# Patient Record
Sex: Male | Born: 1965 | Race: White | Hispanic: No | Marital: Single | Smoking: Current every day smoker
Health system: Southern US, Community
[De-identification: ages and names within clinical notes are randomized; demographics above are authoritative.]

## PROBLEM LIST (undated history)

## (undated) DIAGNOSIS — F419 Anxiety disorder, unspecified: Secondary | ICD-10-CM

## (undated) DIAGNOSIS — N289 Disorder of kidney and ureter, unspecified: Secondary | ICD-10-CM

## (undated) DIAGNOSIS — I2699 Other pulmonary embolism without acute cor pulmonale: Secondary | ICD-10-CM

## (undated) DIAGNOSIS — F1910 Other psychoactive substance abuse, uncomplicated: Secondary | ICD-10-CM

## (undated) DIAGNOSIS — I10 Essential (primary) hypertension: Secondary | ICD-10-CM

## (undated) DIAGNOSIS — G709 Myoneural disorder, unspecified: Secondary | ICD-10-CM

## (undated) DIAGNOSIS — S2249XA Multiple fractures of ribs, unspecified side, initial encounter for closed fracture: Secondary | ICD-10-CM

## (undated) DIAGNOSIS — J939 Pneumothorax, unspecified: Secondary | ICD-10-CM

## (undated) DIAGNOSIS — S42309A Unspecified fracture of shaft of humerus, unspecified arm, initial encounter for closed fracture: Secondary | ICD-10-CM

## (undated) DIAGNOSIS — S2239XA Fracture of one rib, unspecified side, initial encounter for closed fracture: Secondary | ICD-10-CM

## (undated) DIAGNOSIS — M549 Dorsalgia, unspecified: Secondary | ICD-10-CM

## (undated) HISTORY — PX: BACK SURGERY: SHX140

## (undated) HISTORY — DX: Myoneural disorder, unspecified: G70.9

## (undated) HISTORY — DX: Anxiety disorder, unspecified: F41.9

## (undated) HISTORY — DX: Other pulmonary embolism without acute cor pulmonale: I26.99

## (undated) HISTORY — PX: OTHER SURGICAL HISTORY: SHX169

---

## 2000-10-14 ENCOUNTER — Encounter (HOSPITAL_COMMUNITY): Admission: RE | Admit: 2000-10-14 | Discharge: 2000-11-13 | Payer: Self-pay | Admitting: Orthopaedic Surgery

## 2001-01-28 ENCOUNTER — Encounter: Payer: Self-pay | Admitting: Orthopedic Surgery

## 2001-01-28 ENCOUNTER — Ambulatory Visit (HOSPITAL_COMMUNITY): Admission: RE | Admit: 2001-01-28 | Discharge: 2001-01-28 | Payer: Self-pay | Admitting: Orthopedic Surgery

## 2001-03-02 ENCOUNTER — Ambulatory Visit (HOSPITAL_COMMUNITY): Admission: RE | Admit: 2001-03-02 | Discharge: 2001-03-02 | Payer: Self-pay | Admitting: Orthopedic Surgery

## 2003-08-14 ENCOUNTER — Encounter: Payer: Self-pay | Admitting: Orthopedic Surgery

## 2004-05-03 ENCOUNTER — Ambulatory Visit (HOSPITAL_COMMUNITY): Admission: RE | Admit: 2004-05-03 | Discharge: 2004-05-03 | Payer: Self-pay | Admitting: Family Medicine

## 2004-08-21 ENCOUNTER — Ambulatory Visit: Payer: Self-pay | Admitting: Orthopedic Surgery

## 2004-09-11 ENCOUNTER — Ambulatory Visit: Payer: Self-pay | Admitting: Orthopedic Surgery

## 2005-08-05 ENCOUNTER — Ambulatory Visit (HOSPITAL_COMMUNITY): Admission: RE | Admit: 2005-08-05 | Discharge: 2005-08-05 | Payer: Self-pay | Admitting: Family Medicine

## 2005-09-30 ENCOUNTER — Ambulatory Visit (HOSPITAL_COMMUNITY): Admission: RE | Admit: 2005-09-30 | Discharge: 2005-09-30 | Payer: Self-pay | Admitting: Neurosurgery

## 2005-11-21 ENCOUNTER — Ambulatory Visit: Payer: Self-pay | Admitting: Physical Medicine & Rehabilitation

## 2005-11-21 ENCOUNTER — Encounter
Admission: RE | Admit: 2005-11-21 | Discharge: 2006-02-19 | Payer: Self-pay | Admitting: Physical Medicine & Rehabilitation

## 2006-09-25 ENCOUNTER — Encounter
Admission: RE | Admit: 2006-09-25 | Discharge: 2006-12-24 | Payer: Self-pay | Admitting: Physical Medicine & Rehabilitation

## 2006-10-13 ENCOUNTER — Ambulatory Visit: Payer: Self-pay | Admitting: Physical Medicine & Rehabilitation

## 2006-11-24 ENCOUNTER — Ambulatory Visit: Payer: Self-pay | Admitting: Physical Medicine & Rehabilitation

## 2006-12-23 ENCOUNTER — Encounter
Admission: RE | Admit: 2006-12-23 | Discharge: 2007-03-23 | Payer: Self-pay | Admitting: Physical Medicine & Rehabilitation

## 2007-02-15 ENCOUNTER — Ambulatory Visit: Payer: Self-pay | Admitting: Physical Medicine & Rehabilitation

## 2007-04-02 ENCOUNTER — Ambulatory Visit: Payer: Self-pay | Admitting: Physical Medicine & Rehabilitation

## 2007-04-02 ENCOUNTER — Encounter
Admission: RE | Admit: 2007-04-02 | Discharge: 2007-07-01 | Payer: Self-pay | Admitting: Physical Medicine & Rehabilitation

## 2007-04-14 ENCOUNTER — Encounter (HOSPITAL_COMMUNITY)
Admission: RE | Admit: 2007-04-14 | Discharge: 2007-05-14 | Payer: Self-pay | Admitting: Physical Medicine & Rehabilitation

## 2007-05-10 ENCOUNTER — Ambulatory Visit: Payer: Self-pay | Admitting: Physical Medicine & Rehabilitation

## 2007-05-17 ENCOUNTER — Encounter
Admission: RE | Admit: 2007-05-17 | Discharge: 2007-06-09 | Payer: Self-pay | Admitting: Physical Medicine & Rehabilitation

## 2007-06-01 ENCOUNTER — Ambulatory Visit: Payer: Self-pay | Admitting: Physical Medicine & Rehabilitation

## 2007-06-28 ENCOUNTER — Encounter
Admission: RE | Admit: 2007-06-28 | Discharge: 2007-07-09 | Payer: Self-pay | Admitting: Physical Medicine & Rehabilitation

## 2007-11-30 ENCOUNTER — Observation Stay (HOSPITAL_COMMUNITY): Admission: RE | Admit: 2007-11-30 | Discharge: 2007-12-01 | Payer: Self-pay | Admitting: Neurosurgery

## 2008-01-03 ENCOUNTER — Encounter (HOSPITAL_COMMUNITY): Admission: RE | Admit: 2008-01-03 | Discharge: 2008-02-02 | Payer: Self-pay | Admitting: Neurosurgery

## 2008-02-23 ENCOUNTER — Ambulatory Visit (HOSPITAL_COMMUNITY): Admission: RE | Admit: 2008-02-23 | Discharge: 2008-02-24 | Payer: Self-pay | Admitting: Neurosurgery

## 2008-03-27 ENCOUNTER — Emergency Department (HOSPITAL_COMMUNITY): Admission: EM | Admit: 2008-03-27 | Discharge: 2008-03-27 | Payer: Self-pay | Admitting: Emergency Medicine

## 2008-03-29 ENCOUNTER — Encounter (HOSPITAL_COMMUNITY): Admission: RE | Admit: 2008-03-29 | Discharge: 2008-04-28 | Payer: Self-pay | Admitting: Neurosurgery

## 2008-05-09 ENCOUNTER — Ambulatory Visit (HOSPITAL_COMMUNITY): Admission: RE | Admit: 2008-05-09 | Discharge: 2008-05-09 | Payer: Self-pay | Admitting: Neurosurgery

## 2008-05-23 ENCOUNTER — Inpatient Hospital Stay (HOSPITAL_COMMUNITY): Admission: RE | Admit: 2008-05-23 | Discharge: 2008-05-26 | Payer: Self-pay | Admitting: Neurosurgery

## 2008-06-21 ENCOUNTER — Inpatient Hospital Stay (HOSPITAL_COMMUNITY): Admission: EM | Admit: 2008-06-21 | Discharge: 2008-06-26 | Payer: Self-pay | Admitting: Emergency Medicine

## 2008-06-27 ENCOUNTER — Encounter (HOSPITAL_COMMUNITY): Admission: RE | Admit: 2008-06-27 | Discharge: 2008-07-27 | Payer: Self-pay | Admitting: Family Medicine

## 2008-06-27 ENCOUNTER — Ambulatory Visit (HOSPITAL_COMMUNITY): Payer: Self-pay | Admitting: Family Medicine

## 2008-08-21 ENCOUNTER — Ambulatory Visit (HOSPITAL_COMMUNITY): Admission: RE | Admit: 2008-08-21 | Discharge: 2008-08-21 | Payer: Self-pay | Admitting: Family Medicine

## 2008-08-22 ENCOUNTER — Encounter (HOSPITAL_COMMUNITY): Admission: RE | Admit: 2008-08-22 | Discharge: 2008-09-21 | Payer: Self-pay | Admitting: Neurosurgery

## 2008-09-21 ENCOUNTER — Emergency Department (HOSPITAL_COMMUNITY): Admission: EM | Admit: 2008-09-21 | Discharge: 2008-09-21 | Payer: Self-pay | Admitting: Emergency Medicine

## 2008-09-27 ENCOUNTER — Encounter (HOSPITAL_COMMUNITY): Admission: RE | Admit: 2008-09-27 | Discharge: 2008-10-27 | Payer: Self-pay | Admitting: Neurosurgery

## 2008-12-03 ENCOUNTER — Emergency Department (HOSPITAL_COMMUNITY): Admission: EM | Admit: 2008-12-03 | Discharge: 2008-12-03 | Payer: Self-pay | Admitting: Emergency Medicine

## 2008-12-19 ENCOUNTER — Encounter: Payer: Self-pay | Admitting: Family Medicine

## 2008-12-21 ENCOUNTER — Ambulatory Visit: Payer: Self-pay | Admitting: Orthopedic Surgery

## 2008-12-21 ENCOUNTER — Ambulatory Visit (HOSPITAL_COMMUNITY): Admission: RE | Admit: 2008-12-21 | Discharge: 2008-12-21 | Payer: Self-pay | Admitting: Family Medicine

## 2008-12-21 DIAGNOSIS — M23302 Other meniscus derangements, unspecified lateral meniscus, unspecified knee: Secondary | ICD-10-CM

## 2008-12-21 DIAGNOSIS — M25569 Pain in unspecified knee: Secondary | ICD-10-CM

## 2008-12-21 DIAGNOSIS — G5620 Lesion of ulnar nerve, unspecified upper limb: Secondary | ICD-10-CM

## 2008-12-21 DIAGNOSIS — M7020 Olecranon bursitis, unspecified elbow: Secondary | ICD-10-CM

## 2008-12-22 ENCOUNTER — Emergency Department (HOSPITAL_COMMUNITY): Admission: EM | Admit: 2008-12-22 | Discharge: 2008-12-22 | Payer: Self-pay | Admitting: Emergency Medicine

## 2008-12-27 ENCOUNTER — Telehealth: Payer: Self-pay | Admitting: Orthopedic Surgery

## 2008-12-28 ENCOUNTER — Ambulatory Visit (HOSPITAL_COMMUNITY): Admission: RE | Admit: 2008-12-28 | Discharge: 2008-12-28 | Payer: Self-pay | Admitting: Orthopedic Surgery

## 2009-01-01 ENCOUNTER — Encounter: Payer: Self-pay | Admitting: Orthopedic Surgery

## 2009-01-25 ENCOUNTER — Encounter: Payer: Self-pay | Admitting: Orthopedic Surgery

## 2009-02-08 ENCOUNTER — Ambulatory Visit (HOSPITAL_COMMUNITY): Admission: RE | Admit: 2009-02-08 | Discharge: 2009-02-08 | Payer: Self-pay | Admitting: Orthopedic Surgery

## 2009-02-26 ENCOUNTER — Telehealth: Payer: Self-pay | Admitting: Orthopedic Surgery

## 2009-04-05 ENCOUNTER — Ambulatory Visit (HOSPITAL_COMMUNITY): Admission: RE | Admit: 2009-04-05 | Discharge: 2009-04-05 | Payer: Self-pay | Admitting: Orthopedic Surgery

## 2009-06-14 IMAGING — CT CT ANGIO CHEST
1 of 2 series · 19 of 32 positions shown · IV contrast (Omnipaque 300)
Comparison: None

CLINICAL DATA: Right-sided chest pain

CT ANGIOGRAPHY CHEST
TECHNIQUE: Multidetector CT imaging of the chest using the
standard protocol during bolus administration of intravenous
contrast. Multiplanar reconstructed images including MIPs were
obtained and reviewed to evaluate the vascular anatomy.
Contrast: 80 ml [id number]-X33

[Series 8: thin pacs · axial · 0.73mm/px · z∈[-362,-88]mm · 19 of 306 slices shown]
[im 16/306  lung]
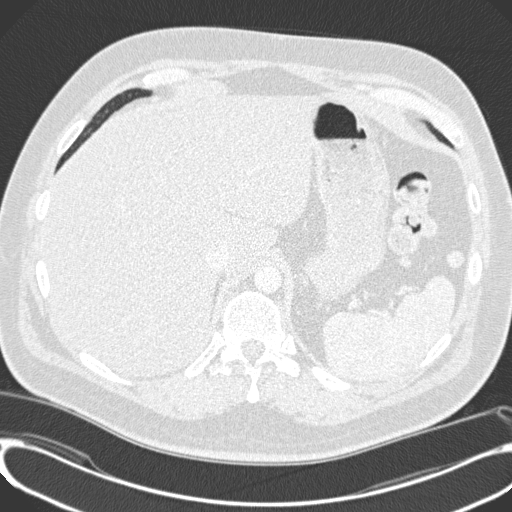
[im 31/306  mediastinal]
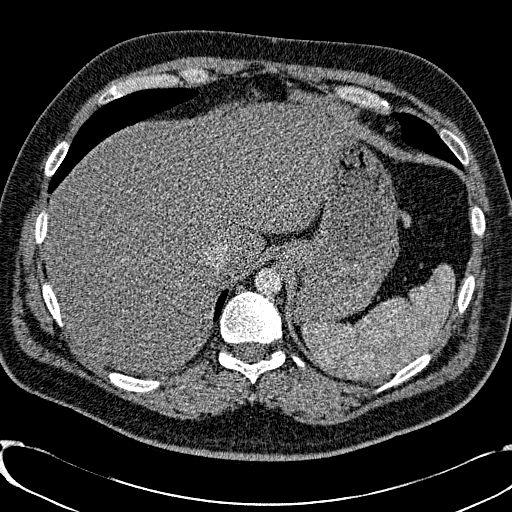
[im 46/306  lung]
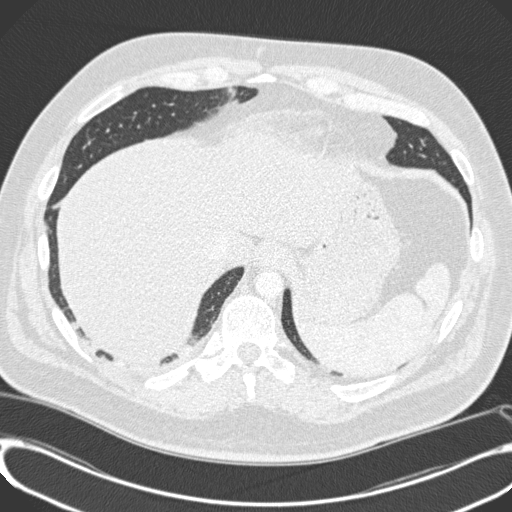
[im 77/306  mediastinal]
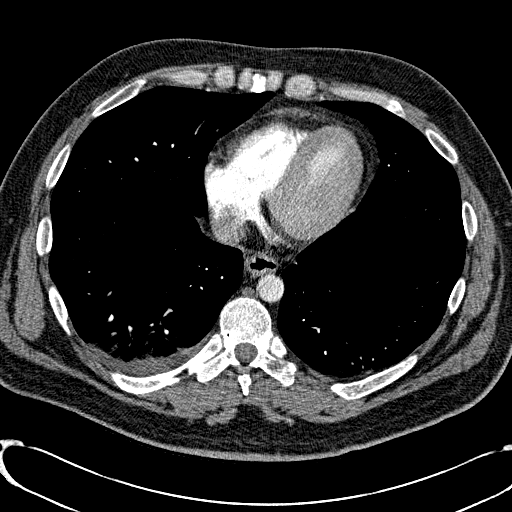
[im 92/306  lung]
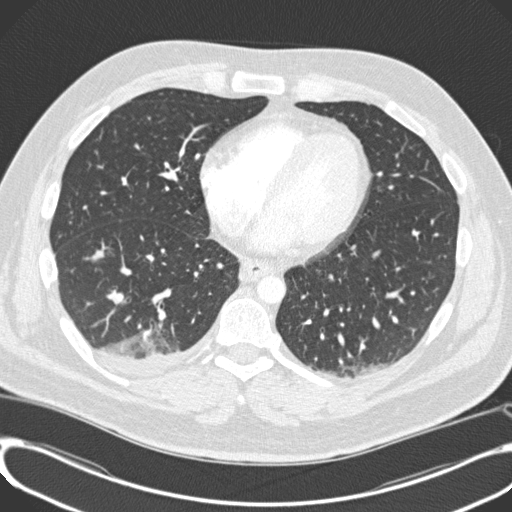
[im 102/306  mediastinal]
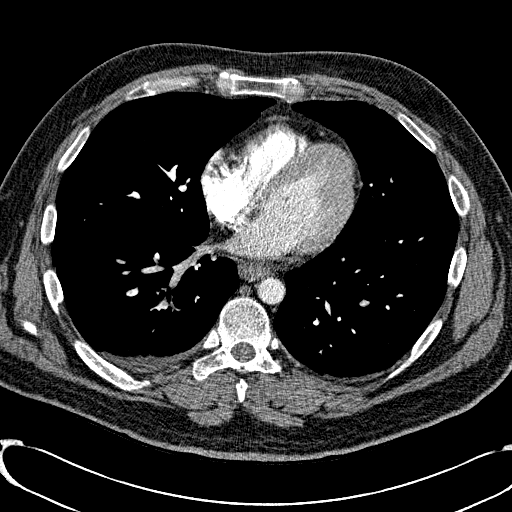
[im 107/306  lung]
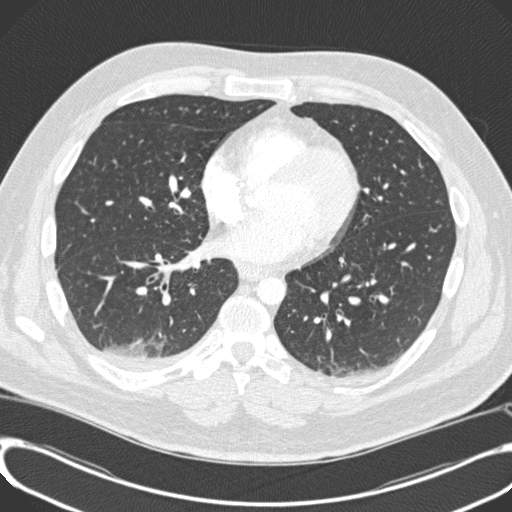
[im 123/306  mediastinal]
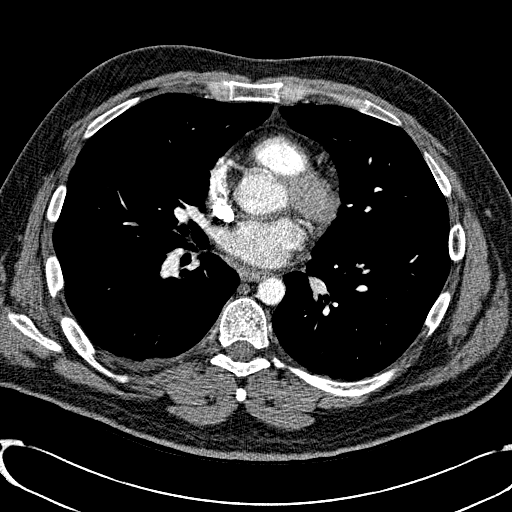
[im 138/306  lung]
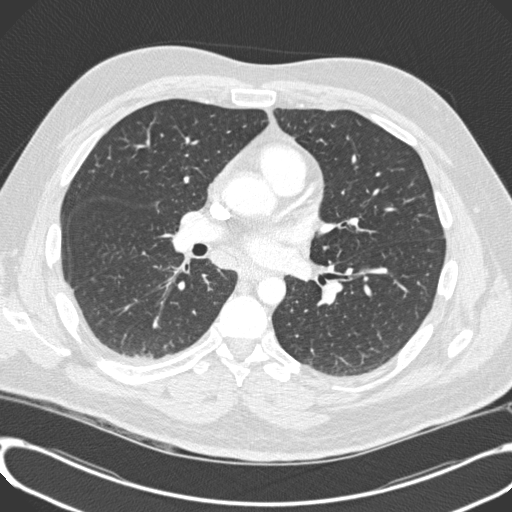
[im 153/306  mediastinal]
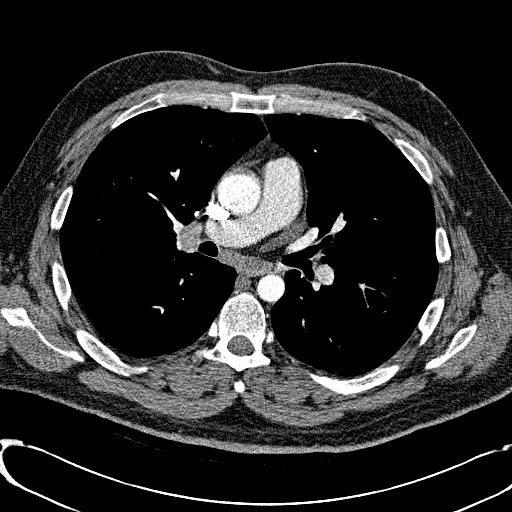
[im 168/306  lung]
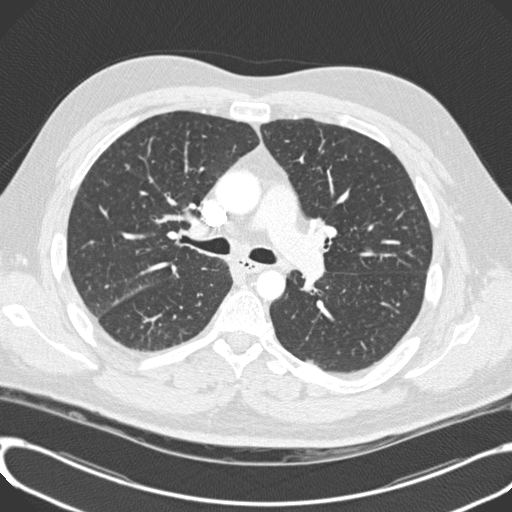
[im 184/306  mediastinal]
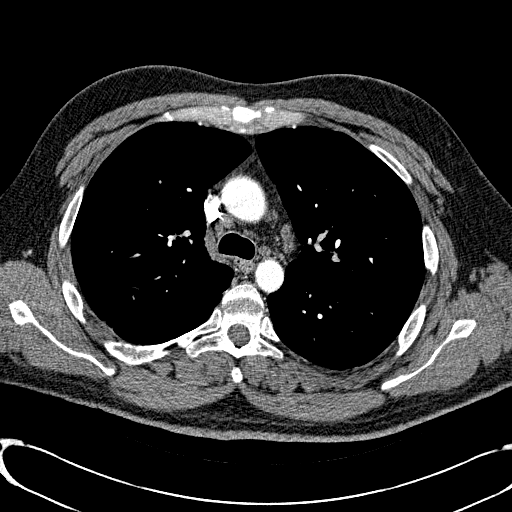
[im 199/306  lung]
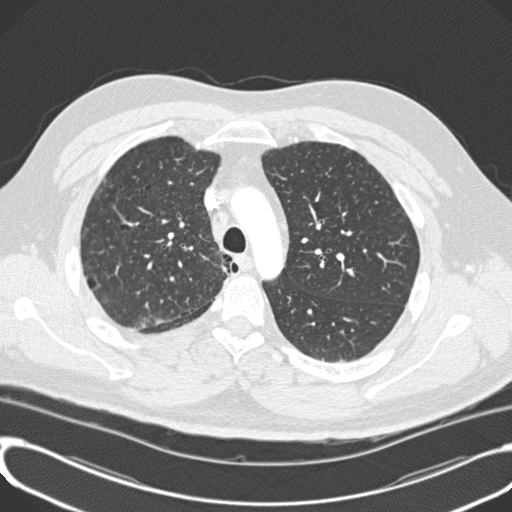
[im 204/306  mediastinal]
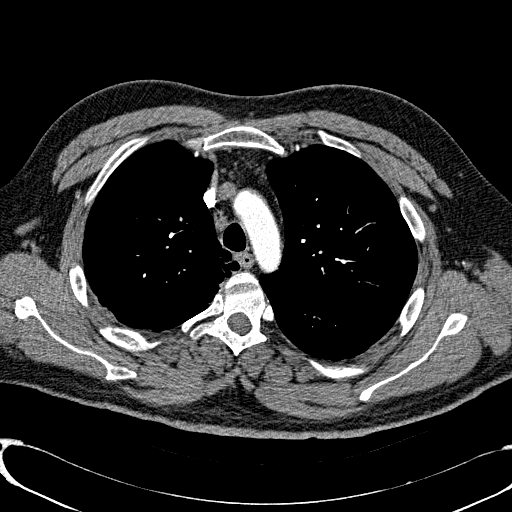
[im 214/306  lung]
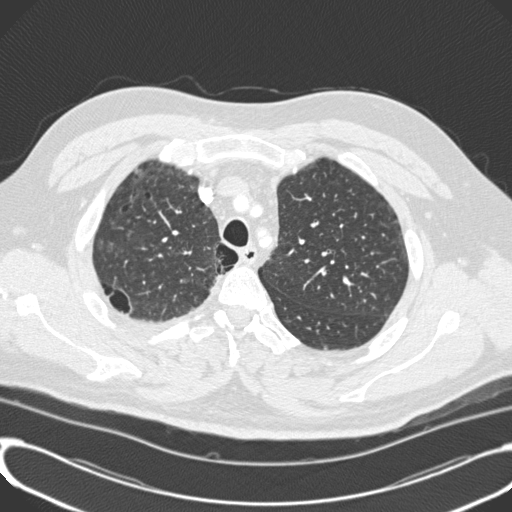
[im 229/306  mediastinal]
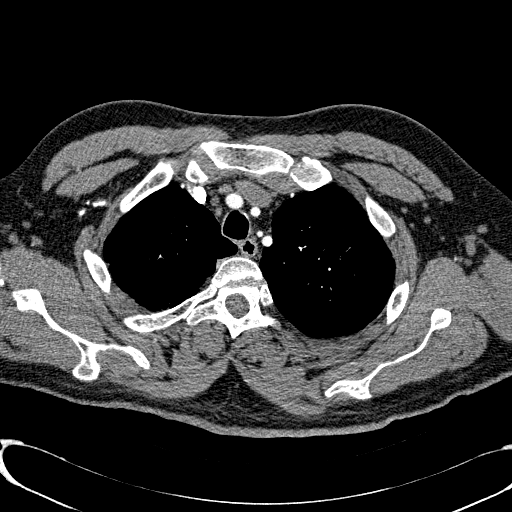
[im 260/306  lung]
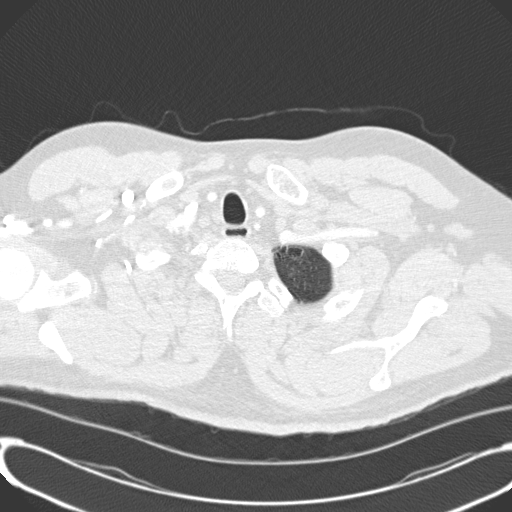
[im 275/306  mediastinal]
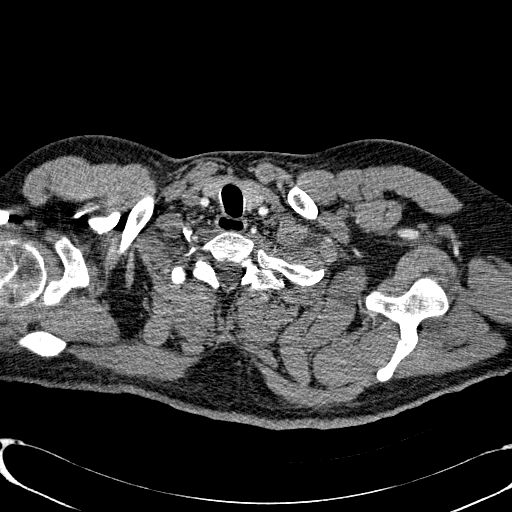
[im 290/306  lung]
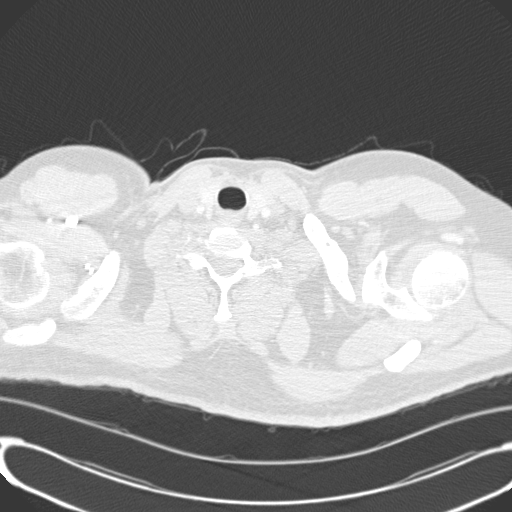

[19 of 32 positions shown; findings below may reference images not displayed]

FINDINGS: The chest wall is unremarkable.  The heart is normal in
size.  No pericardial effusion.  There are borderline mediastinal
and hilar lymph nodes.  The esophagus is grossly normal.  The aorta
is normal in caliber.  No dissection.

The pulmonary arterial tree is suboptimally opacified but there are
filling defects in the right lower lobe pulmonary artery branches
consistent with pulmonary emboli.

Examination of the lung parenchyma demonstrates apical bullous
changes bilaterally.  There is right greater than left lower lobe
atelectasis and a small right pleural effusion.  The visualized
upper abdomen is unremarkable.
IMPRESSION: 1.  Right-sided pulmonary emboli.
2.  Normal thoracic aorta.
3.  Borderline mediastinal and hilar lymph nodes.
4.  Right greater than left lower lobe atelectasis and small right
effusion.

## 2009-11-07 ENCOUNTER — Encounter: Payer: Self-pay | Admitting: Orthopedic Surgery

## 2010-06-30 ENCOUNTER — Encounter: Payer: Self-pay | Admitting: Neurosurgery

## 2010-07-09 NOTE — Letter (Signed)
Summary: Medical record request [facility]  Medical record request [facility]   Imported By: Cammie Sickle 11/26/2009 18:39:11  _____________________________________________________________________  External Attachment:    Type:   Image     Comment:   External Document

## 2010-09-12 LAB — COMPREHENSIVE METABOLIC PANEL
Albumin: 4.3 g/dL (ref 3.5–5.2)
Alkaline Phosphatase: 79 U/L (ref 39–117)
BUN: 7 mg/dL (ref 6–23)
CO2: 26 mEq/L (ref 19–32)
Chloride: 104 mEq/L (ref 96–112)
Glucose, Bld: 82 mg/dL (ref 70–99)
Potassium: 4.6 mEq/L (ref 3.5–5.1)
Total Bilirubin: 0.3 mg/dL (ref 0.3–1.2)

## 2010-09-12 LAB — CBC
HCT: 49 % (ref 39.0–52.0)
Hemoglobin: 16.7 g/dL (ref 13.0–17.0)
Platelets: 236 10*3/uL (ref 150–400)
RBC: 5.6 MIL/uL (ref 4.22–5.81)
WBC: 11.3 10*3/uL — ABNORMAL HIGH (ref 4.0–10.5)

## 2010-09-12 LAB — DIFFERENTIAL
Basophils Absolute: 0 10*3/uL (ref 0.0–0.1)
Basophils Relative: 0 % (ref 0–1)
Monocytes Absolute: 0.5 10*3/uL (ref 0.1–1.0)
Neutro Abs: 7.3 10*3/uL (ref 1.7–7.7)

## 2010-09-15 LAB — BASIC METABOLIC PANEL
BUN: 8 mg/dL (ref 6–23)
Creatinine, Ser: 0.9 mg/dL (ref 0.4–1.5)
GFR calc non Af Amer: >60 mL/min (ref >60)
Potassium: 4 mEq/L (ref 3.5–5.1)

## 2010-09-15 LAB — DIFFERENTIAL
Lymphocytes Relative: 32 % (ref 12–46)
Lymphs Abs: 2.2 10*3/uL (ref 0.7–4.0)
Neutro Abs: 4 10*3/uL (ref 1.7–7.7)
Neutrophils Relative %: 57 % (ref 43–77)

## 2010-09-15 LAB — CBC
Platelets: 204 10*3/uL (ref 150–400)
WBC: 6.9 10*3/uL (ref 4.0–10.5)

## 2010-09-15 LAB — PROTIME-INR
INR: 2 — ABNORMAL HIGH (ref 0.00–1.49)
Prothrombin Time: 24.1 seconds — ABNORMAL HIGH (ref 11.6–15.2)

## 2010-09-23 LAB — BASIC METABOLIC PANEL
BUN: 10 mg/dL (ref 6–23)
CO2: 27 mEq/L (ref 19–32)
Chloride: 101 mEq/L (ref 96–112)
Creatinine, Ser: 1.01 mg/dL (ref 0.4–1.5)

## 2010-09-23 LAB — DIFFERENTIAL
Basophils Absolute: 0 10*3/uL (ref 0.0–0.1)
Basophils Relative: 1 % (ref 0–1)
Basophils Relative: 3 % — ABNORMAL HIGH (ref 0–1)
Eosinophils Absolute: 0.2 10*3/uL (ref 0.0–0.7)
Eosinophils Absolute: 0.3 10*3/uL (ref 0.0–0.7)
Eosinophils Relative: 2 % (ref 0–5)
Eosinophils Relative: 3 % (ref 0–5)
Lymphocytes Relative: 26 % (ref 12–46)
Monocytes Absolute: 0.7 10*3/uL (ref 0.1–1.0)
Monocytes Relative: 6 % (ref 3–12)
Monocytes Relative: 9 % (ref 3–12)
Neutrophils Relative %: 62 % (ref 43–77)
Neutrophils Relative %: 79 % — ABNORMAL HIGH (ref 43–77)
Neutrophils Relative %: 59 % (ref 43–77)

## 2010-09-23 LAB — PROTIME-INR
INR: 1.1 (ref 0.00–1.49)
INR: 1.1 (ref 0.00–1.49)
INR: 1.6 — ABNORMAL HIGH (ref 0.00–1.49)
Prothrombin Time: 15.3 seconds — ABNORMAL HIGH (ref 11.6–15.2)

## 2010-09-23 LAB — CBC
HCT: 37.8 % — ABNORMAL LOW (ref 39.0–52.0)
HCT: 39.7 % (ref 39.0–52.0)
Hemoglobin: 11.4 g/dL — ABNORMAL LOW (ref 13.0–17.0)
MCHC: 32.9 g/dL (ref 30.0–36.0)
MCHC: 33.1 g/dL (ref 30.0–36.0)
MCHC: 32.1 g/dL (ref 30.0–36.0)
MCV: 82.1 fL (ref 78.0–100.0)
MCV: 82.1 fL (ref 78.0–100.0)
Platelets: 230 10*3/uL (ref 150–400)
Platelets: 262 10*3/uL (ref 150–400)
RBC: 4.83 MIL/uL (ref 4.22–5.81)
RBC: 4.3 MIL/uL (ref 4.22–5.81)
RDW: 15.1 % (ref 11.5–15.5)

## 2010-09-23 LAB — POCT CARDIAC MARKERS

## 2010-10-22 ENCOUNTER — Emergency Department (HOSPITAL_COMMUNITY)
Admission: EM | Admit: 2010-10-22 | Discharge: 2010-10-22 | Disposition: A | Discharge status: Home / Self Care | Payer: Medicare Other | Attending: Emergency Medicine | Admitting: Emergency Medicine

## 2010-10-22 ENCOUNTER — Emergency Department (HOSPITAL_COMMUNITY): Payer: Medicare Other

## 2010-10-22 DIAGNOSIS — M79606 Pain in leg, unspecified: Secondary | ICD-10-CM

## 2010-10-22 DIAGNOSIS — Z79899 Other long term (current) drug therapy: Secondary | ICD-10-CM | POA: Insufficient documentation

## 2010-10-22 DIAGNOSIS — Z86718 Personal history of other venous thrombosis and embolism: Secondary | ICD-10-CM | POA: Insufficient documentation

## 2010-10-22 DIAGNOSIS — M79609 Pain in unspecified limb: Secondary | ICD-10-CM | POA: Insufficient documentation

## 2010-10-22 DIAGNOSIS — E7800 Pure hypercholesterolemia, unspecified: Secondary | ICD-10-CM | POA: Insufficient documentation

## 2010-10-22 DIAGNOSIS — Z86711 Personal history of pulmonary embolism: Secondary | ICD-10-CM | POA: Insufficient documentation

## 2010-10-22 DIAGNOSIS — M7989 Other specified soft tissue disorders: Secondary | ICD-10-CM

## 2010-10-22 LAB — URINALYSIS, ROUTINE W REFLEX MICROSCOPIC
Glucose, UA: NEGATIVE mg/dL
Ketones, ur: 40 mg/dL — AB
Specific Gravity, Urine: >1.03 — ABNORMAL HIGH (ref 1.005–1.030)
pH: 6 (ref 5.0–8.0)

## 2010-10-22 LAB — RAPID URINE DRUG SCREEN, HOSP PERFORMED
Benzodiazepines: POSITIVE — AB
Cocaine: POSITIVE — AB
Opiates: POSITIVE — AB

## 2010-10-22 LAB — DIFFERENTIAL
Eosinophils Absolute: 0.1 10*3/uL (ref 0.0–0.7)
Eosinophils Relative: 1 % (ref 0–5)
Lymphs Abs: 0.8 10*3/uL (ref 0.7–4.0)
Monocytes Relative: 9 % (ref 3–12)

## 2010-10-22 LAB — BASIC METABOLIC PANEL
BUN: 9 mg/dL (ref 6–23)
Chloride: 104 mEq/L (ref 96–112)
Glucose, Bld: 101 mg/dL — ABNORMAL HIGH (ref 70–99)
Potassium: 3.8 mEq/L (ref 3.5–5.1)

## 2010-10-22 LAB — CBC
MCH: 28 pg (ref 26.0–34.0)
MCV: 86.3 fL (ref 78.0–100.0)
Platelets: 254 10*3/uL (ref 150–400)
RDW: 13.4 % (ref 11.5–15.5)

## 2010-10-22 LAB — ETHANOL: Alcohol, Ethyl (B): <11 mg/dL — ABNORMAL HIGH (ref 0–10)

## 2010-10-22 NOTE — Procedures (Signed)
NAMEASHRITH, [name]               ACCOUNT NO.:  [id number]   MEDICAL RECORD NO.:  [id number]          PATIENT TYPE:  REC   LOCATION:  TPC                          FACILITY:  [facility]   PHYSICIAN:  Erick Colace, M.D.DATE OF BIRTH:  Aug 06, 1965   DATE OF PROCEDURE:  DATE OF DISCHARGE:                               OPERATIVE REPORT   PROCEDURE:  Right L5 dorsal ramus injection and radiofrequency  neurotomy, right L4 medial branch block and radiofrequency neurotomy,  right L3 medial branch block and radiofrequency neurotomy under  fluoroscopic guidance.   INDICATIONS:  Lumbar facet syndrome with good response to medial branch  blocks and pain only partially responsive to narcotic analgesic  medications and other adjuvant care.   DESCRIPTION OF PROCEDURE:  Informed consent was obtained after  describing risks and benefits of the procedure with the patient.  These  include bleeding, bruising, infection, loss of bowel and bladder  function, temporary or permanent paralysis.  He elects to proceed and  has given written consent.  The patient placed prone on the fluoroscopy  table.  Betadine prep, sterile drape.  25-gauge 1-1/2-needle was used to  incise skin and subcu tissue.  1% lidocaine x2 mL and then a 20-gauge 10-  cm RF needle with 10-mm curved active tip was inserted under  fluoroscopic guidance, targeting first the right S1 SAP/sacral ala  junction.  Bone contact made and confirmed with lateral imaging.  Motor  stem x3 volts demonstrated no lower extremity twitch followed by  injection of 1 mL of solution containing 1 mL of 4 mg/mL dexamethasone  and 2 mL 1% lidocaine followed by RF lesioning 70 degrees x70 seconds  and then the right L5 SAP/transverse process junction targeted.  Bone  contact made and confirmed with lateral imaging.  Motor stem x3 volts  demonstrated no lower extremity twitch followed by injection of  dexamethasone and lidocaine solution and RF lesioning 70  degrees x70  seconds and then the right L3 SAP/transverse process junction targeted,  bone contact made and confirmed with lateral imaging.  Motor stem x3  volts demonstrated no lower extremity twitch followed by injection of  dexamethasone and lidocaine solution and RF lesioning 70 degrees  x70  seconds.  The patient tolerated the procedure well.  Pre and post-  injection vitals stable.  Pre-injection pain level 6-7/10, post-  injection 0/10.      Erick Colace, M.D.  Electronically Signed     AEK/MEDQ  D:  02/15/2007 16:10:59  T:  02/16/2007 10:42:36  Job:  [id number]

## 2010-10-22 NOTE — Discharge Summary (Signed)
NAMEJOAQUIM, [name]               ACCOUNT NO.:  [id number]   MEDICAL RECORD NO.:  [id number]          PATIENT TYPE:  INP   LOCATION:  A329                          FACILITY:  [facility]   PHYSICIAN:  Mila Homer. Sudie Bailey, M.D.DATE OF BIRTH:  06-30-1965   DATE OF ADMISSION:  06/21/2008  DATE OF DISCHARGE:  01/18/2010LH                               DISCHARGE SUMMARY   This 45 year old was admitted to the hospital with a pulmonary embolus.  He had a benign 6-day hospitalization extending from June 21, 2008,  to June 26, 2008.  Vital signs remained stable.   His admission white cell count is 12,600, hemoglobin 13.0, and platelet  count 362,000 with 79% neutrophils noted.  BMET showed sodium 134,  glucose 116.  Cardiac markers were normal, and PT/INRs were followed  closely during the hospitalization, with an INR starting at 1.1.  First  day Coumadin was used up to 1.8 the day of discharge.  Repeat CBC was  normal during that time.   Admission chest x-ray showed chronic-appearing lung changes, no definite  pulmonary findings, but CT angio of the chest showed right-sided  pulmonary emboli and borderline mediastinal hilar lymph nodes and right  greater than left lower lobe atelectasis, small right effusion.   He was admitted to the hospital.  He was continued on Lyrica 75 mg  t.i.d., diazepam 10 mg b.i.d., started on Coumadin 5 mg, given a normal  saline IV, Zofran for nausea, oxycodone 50 mg q.4 h. for pain, started  Lovenox mg/kg q.12 h.   The second day his IV was discontinued.  He was switched to a Hep-Lock.  He was taken off Lyrica, and oxycodone was increased to 50 mg q.3 h. for  severe pain.  Pharmacy followed his warfarin dose.  Oxycodone was  increased to 30 mg q.4 h. on his third day, and he was given Colace 100  mg daily for constipation.   He gradually improved.  By the day of discharge, he was able to take  deep breaths without having severe pain, which was much  better.   FINAL DISCHARGE DIAGNOSES:  1. Right-sided pulmonary emboli.  2. Recent low back surgery.  3. 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  4. Tobacco use disorder.  5. Anticoagulation.   He was discharged home on his oxycodone 30 mg q.4 h., which is managed  by his neurosurgeon, Dr. Jeral Fruit.  He is to be on Lovenox 150 mg q.12 h.  the next 5 days.  He will be on warfarin 5 mg as directed and will take  2 of them tonight and 2 tomorrow night.  Recheck PT/INR will be done in  2 days.   I have discussed at length with the patient and his mom about the  benefits and dangers of Coumadin and they understand that there is a  narrow therapeutic ratio and that we will need to monitor this very  frequently initially, and  then if it is not used correctly he could get more blood clotting, but  if he has too much of it his bleeding will be very  difficult to stop.  I  have told him the drug can be either life-saving or life-threatening.  He will have close followup with [place].      Mila Homer. Sudie Bailey, M.D.  Electronically Signed     SDK/MEDQ  D:  06/26/2008  T:  06/26/2008  Job:  [id number]

## 2010-10-22 NOTE — H&P (Signed)
NAMEJARAMIAH, BOSSARD               ACCOUNT NO.:  [id number]   MEDICAL RECORD NO.:  [id number]          PATIENT TYPE:  INP   LOCATION:  A329                          FACILITY:  [facility]   PHYSICIAN:  Edward L. Juanetta Gosling, M.D.DATE OF BIRTH:  1965/08/08   DATE OF ADMISSION:  06/21/2008  DATE OF DISCHARGE:  LH                              HISTORY & PHYSICAL   PRIMARY CARE PHYSICIAN:  Mila Homer. Sudie Bailey, M.D.   REASON FOR ADMISSION:  Pulmonary embolus.   HISTORY:  Mr. [name] is a 45 year old who has had recent surgery on his  back x3.  Today he noticed that he had some chest pain and some  shortness of breath when he tried to take a deep breath that got much  worse.  He did not cough up any blood.  He did not have any fever or  chills but he was short of breath.  He eventually came to the emergency  room where a underwent a CT of the chest and that showed that he had  pulmonary emboli on the right side.  This is where is symptoms are.  He  says that otherwise he has been healthy.  He has not noticed any  swelling of his legs.  He has had surgeries on his back but no other  precipitating factors.   PAST MEDICAL HISTORY:  1. Chronic back pain, status post surgery.  2. Hyperlipidemia.  3. He apparently had hypertension at one point.   PAST SURGICAL HISTORY:  Surgically he has had appendectomy and back  surgery.   SOCIAL HISTORY:  Nondrinker.  He does not use any alcohol.  He does  smoke cigars he says.   REVIEW OF SYSTEMS:  His review of systems except as mentioned is  negative.   MEDICATIONS:  1. Oxycodone 15 mg as needed.  2. Diazepam 10 mg as needed.  3. Lyrica 75 mg three times a day.  4. He takes some medication for cholesterol.  He is not clear what      that is.   FAMILY HISTORY:  He does not know of any family history of any sort of  clotting abnormalities.   PHYSICAL EXAMINATION:  GENERAL:  Well-developed, well-nourished male who  is in no acute distress now.  VITAL  SIGNS:  Blood pressure 122/78, temperature is 100.8, pulse 136,  respirations 26.  Later blood pressure 103/62 and pulse 103.  Oxygen  saturation 96% on 2 liters.  HEENT:  His pupils are reactive to light and accommodation.  His nose  and throat are clear.  NECK:  Supple without masses.  He does not have any bruits or jugular  venous distention.  CHEST:  Clear without wheezes, rales or rhonchi.  HEART:  Regular without murmur, gallop or rub.  ABDOMEN:  His abdomen is soft.  No masses are felt.  EXTREMITIES:  Showed no edema.  CENTRAL NERVOUS SYSTEM:  Grossly intact.   ASSESSMENT:  Has had a pulmonary embolus.   PLAN:  He is going to be admitted, started on Lovenox and Coumadin and  Dr. Sudie Bailey will assume his care  in the morning.  I will continue his  regular medications.      Edward L. Juanetta Gosling, M.D.  Electronically Signed     ELH/MEDQ  D:  06/21/2008  T:  06/22/2008  Job:  [id number]

## 2010-10-22 NOTE — Procedures (Signed)
NAMETEX, CONROY               ACCOUNT NO.:  [id number]   MEDICAL RECORD NO.:  [id number]           PATIENT TYPE:   LOCATION:                                 FACILITY:   PHYSICIAN:  Erick Colace, M.D.DATE OF BIRTH:  [birth year]   DATE OF PROCEDURE:  DATE OF DISCHARGE:                               OPERATIVE REPORT   A 45 year old male with left L4transforaminal lumbar epidural steroid  injection under fluoroscopic guidance.   INDICATIONS:  Left L4 radiculopathy with MRI demonstrating L4  impingement on the left side due to lateral recess stenosis, foraminal  stenosis, multifactorial.   Informed consent was obtained after describing the risks and benefits of  the procedure to the patient.  These include bleeding, bruising,  infection, and loss of bowel or bladder function, temporary or permanent  paralysis.  He elects to proceed and has given written consent.  The  patient was placed prone on the fluoroscopy table.  Betadine prep and  sterile drape. A 25-gauge 1-1/2 inch needle was used to anesthetize the  skin and subcutaneous tissue.  Then a 22-gauge 3-1/2-inch spinal needle  was inserted under fluoroscopic guidance targeting the L4-5  intervertebral foramen; AP, lateral, and oblique imaging utilized.  Then  Omnipaque 180 times 0.5 mL demonstrated good nerve root and epidural  spread followed by injection of 1 mL of 40 mg per mL Depo-Medrol and 2  mL of 1% MDF lidocaine.  The patient tolerated the procedure well pre-  and-post injection.  Vitals stable.      Erick Colace, M.D.  Electronically Signed     AEK/MEDQ  D:  01/25/2007 12:02:02  T:  01/25/2007 17:04:22  Job:  [id number]
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
## 2010-10-22 NOTE — Procedures (Signed)
NAMEJACARIE, PATE NO.:  [id number]   MEDICAL RECORD NO.:  [id number]           PATIENT TYPE:   LOCATION:                                 FACILITY:   PHYSICIAN:  Erick Colace, M.D.DATE OF BIRTH:  02-06-1966   DATE OF PROCEDURE:  06/07/2007  DATE OF DISCHARGE:                               OPERATIVE REPORT   Mr. Hernan returns today.  He has a history left L3-4 lateral recess  stenosis, bilateral L4-5 foraminal stenosis.  He has had mainly axial  back pain, but now has more lower extremity pain.  He had a mild level  of relief with L4 injection.  He is here for L3 transforaminal lumbar  epidural steroid injection under fluoroscopic guidance.   INDICATIONS:  Lumbar radiculopathy, unresponsive to ibuprofen, Tylox,  and gabapentin.   Informed consent was obtained after describing risks and benefits of the  procedure with the patient.  These include bleeding, bruising,  infection, loss of bowel or bladder function, and temporary or permanent  paralysis.  She elected to proceed and has given written consent.  The  patient was placed prone on the fluoroscopy table.  Betadine prep and  sterile drape.  A 25-gauge, 1-1/2 inch needle was used to incise skin  and subcutaneous tissue with 1% lidocaine x2 mL.  Then, a 22-gauge, 3-  1/2 inch spinal needle was inserted under fluoroscopic guidance,  targeting the left L3-4 intervertebral foramen.  AP, lateral, and  oblique imaging utilized.  Omnipaque 180 x 0.5 mL demonstrated good dye  flow into the lateral recess and along the nerve root, followed by  injection of 1 mL of 10 mg/mL dexamethasone and 2 mL of 1% MPF  lidocaine.  The patient tolerated the procedure well.  Pre- and  postinjection vitals were stable.  I will see him back in 3-4 weeks.  He  is to follow up with Dr. Jeral Fruit as well.  May benefit from repeat EMG  study, should the present injection be uninformative in terms of level  of nerve root  involvement.      Erick Colace, M.D.  Electronically Signed     AEK/MEDQ  D:  06/07/2007 08:48:50  T:  06/07/2007 09:50:13  Job:  [id number]   cc:   Mila Homer. Sudie Bailey, M.D.  Fax: [phone]

## 2010-10-22 NOTE — Assessment & Plan Note (Signed)
This is a followup visit for back pain and left lower extremity pain.  He has a history of congenital lumbar stenosis, has foraminal stenosis,  L3-4 lateral stenosis, lateral recess, bilateral 4-5 foraminal stenosis,  impingement noted at L3 but not at L4.  He had L3 through S1 facet  degenerative changes on CT myelogram.  An MRI done on August 05, 2005  also showed an L4 impingement on the left.  Did have a normal EMG on  September 26, 2005.   EXAMINATION:  He has normal deep tendon reflexes.  He is able to heel  walk, toe walk.  He has good strength in the lower extremities.   FUNCTIONAL STATUS:  Is working full time.   Followup of medial branch block, he has had some good improvement from  the 6 to a 0 immediately, and this persisted for a couple of hours after  injection.  He did have some post-injection soreness for a couple of  days afterwards in his low back muscles.   IMPRESSION:  1. Lumbar facet syndrome.  Will need to confirm with second set of      medial branch blocks.  2. Left L4 radiculitis due to foraminal stenosis.  This appears to be      intact and positional.   PLAN:  1. Will initiate Neurontin 300 mg nightly, working up to q.i.d.  2. Schedule for medial branch blocks.  3. Consider RF of medial branch blocks are helpful in the short run.  4. May benefit from left L4 transforaminal injection.      Erick Colace, M.D.  Electronically Signed     AEK/MedQ  D:  11/24/2006 10:56:45  T:  11/24/2006 13:51:49  Job #:  [id number]   cc:   Hilda Lias, M.D.  Fax: [phone]

## 2010-10-22 NOTE — Procedures (Signed)
[name], [name]               ACCOUNT NO.:  [id number]   MEDICAL RECORD NO.:  [id number]          PATIENT TYPE:  REC   LOCATION:  TPC                          FACILITY:  [facility]   PHYSICIAN:  Erick Colace, M.D.DATE OF BIRTH:  20-Oct-1965   DATE OF PROCEDURE:  03/15/2007  DATE OF DISCHARGE:                               OPERATIVE REPORT   PROCEDURE:  Left radiofrequency neurotomy under fluoroscopic guidance.   INDICATIONS:  Lumbar facet mediated pain as demonstrated by positive  response medial branch blocks x2.  He had successful right  radiofrequency procedure last month.  His pain has shifted towards the  left side.   Informed consent was obtained after describing risks and benefits of the  procedure to the patient.  These include bleeding, bruising, infection,  and paralysis.  He elects to proceed and has provided written consent.  The patient placed prone on fluoroscopy table.  Betadine prep, sterile  drape.  25-gauge 1-1/2-inch needle was used to anesthetize the skin and  subcu tissue.  1% lidocaine x2 mL and 20-gauge 10-cm RF needle with 10-  mm curved active tip was inserted under fluoroscopic guidance.  Bone  contact, L5 SAP sacral ala junction.  Lateral imaging utilized.  Motor  stem 3 volts demonstrated no lower extremity twitch followed by  injection of 1 mL of a solution containing 4 mg/mL dexamethasone and 2  mL of 2% lidocaine.  Then left the L5/SAP transverse junction targeted  and bone contact made and confirmed with lateral imaging.  Motor stem at  3 volts demonstrated no lower extremity twitch.  Injection of 1 mL of  dexamethasone lidocaine solution followed by RF lesioning 7 degrees x 70  seconds, and last the left L4 SAP transverse junction targeted, bone  contact made and confirmed with lateral imaging.  Motor stem x3 volts  demonstrated no lower extremity twitch followed by injection of 1 mL  dexamethasone lidocaine solution and RF lesioning 7 degrees  x 70  seconds.  The patient tolerated the procedure well.  Post procedure  instructions given.      Erick Colace, M.D.  Electronically Signed     AEK/MEDQ  D:  03/15/2007 11:10:32  T:  03/15/2007 13:41:57  Job:  [id number]

## 2010-10-22 NOTE — Procedures (Signed)
[name], [name]               ACCOUNT NO.:  [id number]   MEDICAL RECORD NO.:  [id number]          PATIENT TYPE:  REC   LOCATION:  TPC                          FACILITY:  [facility]   PHYSICIAN:  Erick Colace, M.D.DATE OF BIRTH:  Mar 03, 1966   DATE OF PROCEDURE:  04/05/2007  DATE OF DISCHARGE:                               OPERATIVE REPORT   A 45 year old male.  He is here for transforaminal lumbar epidural  steroid injection for left lower extremity pain which is only partially  responsive to medication management.  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  The pain is interfering with his working but  has not had to skip work, although he has had to go home early.   Informed consent was obtained after describing risks and benefits of the  procedure to the patient.  These include bleeding, bruising, infection,  loss of bowel/bladder function, temporary permanent paralysis.  He  elected to proceed and has given written consent.   The patient placed prone on fluoroscopy table.  Betadine prepped and  sterile draped.  A 25-gauge 1.5 inch needle was used to incise skin and  subcu tissue, 1% lidocaine x2 mL.  Then a 22 gauge 3.5 inch spinal  needle was inserted under fluoroscopic guidance in the left L4-5  intervertebral foramen.  AP lateral and oblique imaging utilized.  Omnipaque 180 x 0.5 mL demonstrated no intravascular uptake, good  epidural spread, followed by injection of 1 mL of 40mg /ml  mL Depo-  Medrol and 2 mL of 1% MPF lidocaine.  The patient tolerated the  procedure well.  Pre and post injection vitals stable.  He was given  post injection instructions and given the today off of work.  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  He has a driver taking him back to his home.      Erick Colace, M.D.  Electronically Signed     AEK/MEDQ  D:  04/05/2007 17:19:21  T:  04/06/2007 10:10:25  Job:  [id number]

## 2010-10-22 NOTE — H&P (Signed)
NAME:  [name], [name] NO.:  [id number]   MEDICAL RECORD NO.:  [id number]          PATIENT TYPE:  OBV   LOCATION:  3599                         FACILITY:  [facility]   PHYSICIAN:  Hilda Lias, M.D.   DATE OF BIRTH:  Aug 18, 1965   DATE OF ADMISSION:  11/30/2007  DATE OF DISCHARGE:                              HISTORY & PHYSICAL   HISTORY OF PRESENT ILLNESS:  The patient has been in follow up in my  office for almost 2 years complaining of back pain radiates down to the  left leg associated with weakness.  Back in March 2007, we made the  diagnosis of L5-S1 herniated disk.  I have been following him but lately  he came about a month ago complaining of pain down to the left leg.  Conservative treatment including epidural injection has been of no help.  The MRI showed that he has a herniated disk at the level of L3-L4 with  an extraforaminal disk at L4-L5.  He is being admitted for surgery.   PAST MEDICAL HISTORY:  Negative.   ALLERGIES:  He is not allergic to any medications.   SOCIAL HISTORY:  He smokes.  He does drink socially.   FAMILY HISTORY:  Unremarkable.   REVIEW OF SYSTEMS:  Positive for back and left leg pain.   PHYSICAL EXAMINATION:  GENERAL:  The patient came to my office with a  limp from the left leg.  HEAD, EARS, NOSE AND THROAT:  Normal.  NECK:  Normal.  LUNGS:  Clear.  HEART:  Sound normal.  ABDOMEN:  Normal.  EXTREMITIES:  Normal pulse.  NEUROLOGIC:  Showed that the straight leg raising in the left side.  SLR  is positive at 80 degrees.  The femoral stretch maneuver is negative in  the right side and is highly positive in the left side.  He has a  decrease of flexibility of the lumbar spine.   The x-rays showed that he has a herniated disk at the level L3-L4 mostly  extraforaminal compromising the left L3-L4 nerve root and also he has a  stenosis mostly at the L4-L5 to the left side.   CLINICAL IMPRESSION:  Left L3-L4 extraforaminal  herniated disk.  L4-L5  stenosis.   RECOMMENDATIONS:  This patient being admitted for surgery.  The  procedure will be left L3-L4 extraforaminal diskectomy and L4-L5  foraminotomy.  The patient knows about the risks such as infection, CSF  leak, worsening pain, no improvement whatsoever because the chronicity  of the pain, needed for discharge immediately.           ______________________________  Hilda Lias, M.D.     EB/MEDQ  D:  11/30/2007  T:  11/30/2007  Job:  [id number]
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
## 2010-10-22 NOTE — Assessment & Plan Note (Signed)
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  He had a normal EMG performed September 26, 2005.  He has had  previous very good relief with lumbar facet injections and this has  resulted in some relief of his back pain.  His bigger issue more  recently that is in the  3-4 months is gradually increasing left lower  extremity pain.  He has had temporary relief on 2 occasions with L4/5  injections.  He was last seen by me on April 05, 2007 when we did left  L4 selective nerve root/transforaminal epidural injection.  Once again  he had some temporary, partial relief.   In the interval time he has seen his primary care physician for some  exacerbation of pain and after calling me and getting the okay he did  prescribe narcotic analgesics.  The patient has had a history of  marijuana abuse in the past but most recent UDS has been negative.   He has had 1 episode where his leg felt like it was going to buckle.  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  His numbness is mainly in the knee area and does not extend into  the foot.  Sometimes goes into the calf.   REVIEW OF SYSTEMS:  Positive for depression and anxiety but negative for  suicidal thoughts.  Positive for poor sleep as well.   SOCIAL HISTORY:  He is single, he lives with his mother.  He works full-  time.   PHYSICAL EXAMINATION:  VITAL SIGNS:  Blood pressure 122/85.  Dyna map  unit of pulse 118,  However check of pulse showed it to be 90, respirations 18, 02 sat 100%  on room air.  GENERAL:  No acute distress.  Mood and affect appropriate.  EXTREMITIES:  Gait shows no evidence of toe drop or any instability.  He  has normal strength in upper and lower extremities.  The sensation is  reduced left T6 and left L3 dermatomes.  His femoral stretch test is  positive on the left side in  the anterior thigh area.  Straight leg  raising test is negative.  Upper range of motion is normal.  Knee and  ankle range of motion are normal, no evidence of knee or ankle swelling.  No peripheral edema.  Peripheral pulses are normal.  BACK:  Has some tenderness in the lumbosacral junction worse with  extension than with flexion.   IMPRESSION:  L3 radiculopathy.  He has had only partial relief with L4  transforaminal, we will go to L3 level.  He has also had Physical  Therapy without much improvement.   PLAN:  1. We will reinstitute ibuprofen 800 t.i.d., he will take this during      the day.  2. Tylox 500 q.p.m. when he gets home from work and q.h.s..  3. Gabapentin 300 mg q.h.s. and increase to b.i.d. x 3 days and then      t.i.d. thereafter.  4. We will ask Dr. Jeral Fruit to reevaluate, he will probably need a      repeat lumbar imaging study and we will leave it      up to Dr. Jeral Fruit in terms of ordering this.  5. We will get a urine drug screen today as well.      Erick Colace, M.D.  Electronically Signed     AEK/MedQ  D:  05/11/2007 10:25:09  T:  05/11/2007 11:04:22  Job #:  [id number]   cc:   Hilda Lias, M.D.  Fax: [phone]

## 2010-10-22 NOTE — Group Therapy Note (Signed)
[name], [name]               ACCOUNT NO.:  [id number]   MEDICAL RECORD NO.:  [id number]         PATIENT TYPE:  PINP   LOCATION:  A329                          FACILITY:  [facility]   PHYSICIAN:  Mila Homer. Sudie Bailey, M.D.DATE OF BIRTH:  1966/03/20   DATE OF PROCEDURE:  DATE OF DISCHARGE:                                 PROGRESS NOTE   SUBJECTIVE:  He is still having good deal of pain in the right chest  with breathing and particularly with coughing.  He notes he was on 30 mg  of oxycodone at home after his back surgery.  Pain is being controlled  with 15-mg oxycodone he is on currently.   OBJECTIVE:  VITAL SIGNS:  Temperature 98.4, pulse 97 respiratory rate  20, blood pressure 124/83.  He is in a semirecumbent bed.  No acute  distress.  GENERAL:  Well developed, well nourished, oriented, alert.  Mom and  brother are in attendance.  LUNGS:  Show decreased breath sounds throughout, but he is moving air  well without intercostal retraction or use of accessory muscle with  respiration.  HEART:  Irregular rhythm, rate of 90.  ABDOMEN:  Soft.  EXTREMITIES:  There is no edema of the ankles.   White cell count today is 9400, hemoglobin 12.5, INR of 1.2.  His O2  sats 98 and 96% on room air.   ASSESSMENT:  1. Pulmonary emboli.  2. Anticoagulation.  3. Status post 3 low back surgeries in half a year.  4. Tobacco use disorder.  5. Chronic obstructive pulmonary disease with bullous changes of the      apices.   PLAN:  Continue with his Lovenox subcu and gradually advancing his  Coumadin.  I discussed the long-term use of Coumadin at length with the  patient and his mom.  Increase his oxycodone to 30 mg q. 4 h.      Mila Homer. Sudie Bailey, M.D.  Electronically Signed     SDK/MEDQ  D:  06/23/2008  T:  06/23/2008  Job:  [id number]

## 2010-10-22 NOTE — Group Therapy Note (Signed)
[name], [name]               ACCOUNT NO.:  [id number]   MEDICAL RECORD NO.:  [id number]         PATIENT TYPE:  PINP   LOCATION:  A329                          FACILITY:  [facility]   PHYSICIAN:  Mila Homer. Sudie Bailey, M.D.DATE OF BIRTH:  17-Oct-1965   DATE OF PROCEDURE:  DATE OF DISCHARGE:                                 PROGRESS NOTE   SUBJECTIVE:  He is generally feeling well, but he was lying in his right  side earlier and he had pain up in his right shoulder and his scapula.   OBJECTIVE:  VITAL SIGNS:  Temperature is 98.4, pulse 103, respiratory  rate 20, blood pressure 112/70.  He is supine in bed.  No acute  distress, just lying there.  GENERAL:  He is well developed, well nourished, oriented, and alert.  As  he tries to sit up in a somewhat recumbent manner he does have pain in  the right chest area.  LUNGS:  Decreased breath sounds throughout, but he is moving air well.  There are no intercostal retractions.  There is no use of accessory  muscles for respiration.  HEART:  Regular rhythm without murmur, rate of 90.  There is no edema of  the ankles and there is no tenderness of the cast.  ABDOMEN:  Soft  without organomegaly or mass.   Today, his O2 sat is 90% room air.  His INR 1.6, PT of 20.   ASSESSMENT:  1. Pulmonary embolus.  2. Anticoagulation.   PLAN:  Continue with the Lovenox subcu and the daily Coumadin.  Daily  PT/INRs.  Continue the oxycodone 30 mg q.4 h for severe pain.  We  discussed his right shoulder pain, which is probably referred pain from  the right diaphragm.      Mila Homer. Sudie Bailey, M.D.  Electronically Signed     SDK/MEDQ  D:  06/24/2008  T:  06/24/2008  Job:  [id number]
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
## 2010-10-22 NOTE — Assessment & Plan Note (Signed)
A 45 year old male with a history of left L3-4 lateral recess stenosis  bilateral L4-5 foraminal stenosis had been having mainly axial back pain  but over the last 3 months has developed increasing left lower extremity  pain and was last seen by me on June 07, 2007, for left L3  transforaminal epidural steroid injection which only minimally reduced  his pain, i.e., from 6 out of 10 to 4 out of 10.  This pain is back at  the 7 out of 10 level and has started to get some right lower extremity  pain.  He had an L3 injection.  He has had an L4 injection on the left  in October 2008, which resulted in a partial temporary relief, i.e., 7  out of 10 to a 5 out of 10.   He had been treated with ibuprofen but as his pain level has increased  over the last month or two, had been started on some oxycodone by his  primary physician.  He had a urine drug screen showing positive THC and  therefore, was retested after abstaining and repeat testing on June 07, 2007, was normal.  He was called in some hydrocodone and instead of  taking 2 a day as prescribed, he took 3 a day and has therefore run out  early.  He has had no leg buckling.  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  He does have an appointment with Dr.  Hilda Lias on July 26, 2007.   SOCIAL HISTORY:  Single lives with his mother.   PAIN SCORE:  Seven out of 10, interfering with activity at a 6 to 7 out  of 10 level.   PHYSICAL EXAMINATION:  VITAL SIGNS:  His blood pressure is 146/90, pulse  111, respirations 20, O2 sat 98% on room air.  GENERAL:  No acute distress.  Mood and affect appropriate.  Gait is  normal.  Orientated x3.  MUSCULOSKELETAL:  He is able to toe-walk, heel-walk.  His motor strength  is full bilateral lower extremities.  Range of motion is normal  bilateral lower extremities.  Deep tendon reflexes are normal bilateral  lower extremities in the Achilles and knee.  His back range of  motion is  75% forward flexion but only 25% extension.  Mild tenderness to  palpation lumbosacral junction.   IMPRESSION:  Low back pain with some radicular type symptoms.   He has had only partial and temporary relief with epidural steroid  injections.  Also, he has had some problems with narcotic analgesic  compliance.  He has more acute symptoms of the right lower extremity  which I think are more inflammatory in nature and I think that he would  benefit from Celebrex 200 mg b.i.d. over the next couple of weeks until  he sees Dr. Jeral Fruit.  In addition, we will increase his Neurontin to 800  t.i.d.  This should help the neuropathic component of his pain.   I will see him back in about a month's time.  He is to call if pain  regimen described above is not particularly helpful.      Erick Colace, M.D.  Electronically Signed     AEK/MedQ  D:  07/09/2007 16:17:12  T:  07/09/2007 23:16:56  Job #:  [id number]   cc:   Mila Homer. Sudie Bailey, M.D.  Fax: [phone]   Hilda Lias, M.D.  Fax: [phone]

## 2010-10-22 NOTE — Group Therapy Note (Signed)
NAMEJEREMI, LOSITO               ACCOUNT NO.:  [id number]   MEDICAL RECORD NO.:  [id number]          PATIENT TYPE:  INP   LOCATION:  A329                          FACILITY:  [facility]   PHYSICIAN:  Mila Homer. Sudie Bailey, M.D.DATE OF BIRTH:  1965-12-01   DATE OF PROCEDURE:  06/22/2008  DATE OF DISCHARGE:                                 PROGRESS NOTE   SUBJECTIVE:  This 45 years old was admitted to the hospital last night  with pulmonary embolus.  He is having a lot of pain in the right side of  the chest.   OBJECTIVE:  Temperature 98.2, pulse 89, respiratory rate 18, blood  pressure 115/68.  His lungs are clear throughout, but no intercostal  retraction or use of accessory muscles of respiration, but he does have  a lot of pain in the right anterior and lateral chest with breathing.  His abdomen is soft without organomegaly or mass.  He has no tenderness  of his calves.   His admission cardiac markers were normal.  White cell count 12,600,  hemoglobin 13, glucose 116, sodium 134.  INR is being 1.1.  Chest x-ray  showed chronic lung changes and his CT angio of the chest showed filling  defects at the right lower lobe, pulmonary artery branches, and also  apical bolus changes bilaterally.   ASSESSMENT:  1. Pulmonary emboli.  2. Chronic obstructive pulmonary disease with bolus changes.  3. Status post recent low back surgery with apparently 2 fusions.  4. Tobacco use disorder.  5. Anticoagulation.   PLAN:  Discussed at length.  He is very concerned that he has no  insurance.  He was able to come in last night.  He has already been  started anticoagulation.  We discussed that his pain is well enough  controlled maybe he will discharged late tomorrow afternoon, which will  be almost 48 hours in the hospital particularly since he lives within  block of the hospital and has his mom on hand to check on him  frequently.  Meanwhile, he has already started on Coumadin, he is  getting daily  Lovenox.      Mila Homer. Sudie Bailey, M.D.  Electronically Signed     SDK/MEDQ  D:  06/22/2008  T:  06/22/2008  Job:  [id number]

## 2010-10-22 NOTE — Op Note (Signed)
[name], [name] NO.:  [id number]   MEDICAL RECORD NO.:  [id number]          PATIENT TYPE:  OBV   LOCATION:  3533                         FACILITY:  [facility]   PHYSICIAN:  Hilda Lias, M.D.   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   POSTOPERATIVE DIAGNOSES:  Left L3-L4 herniated disk, intractable  foraminal with L3 radiculopathy, left L4-5 stenosis.   PROCEDURE:  Left L3-L4 intra and extraforaminal diskectomy at L4-5  foraminotomy using microscope.   SURGICAL PROCEDURES:  Hilda Lias, MD.   ASSISTANT:  Coletta Memos, MD.   CLINICAL HISTORY:  The patient had been complaining of back and left leg  pain for more than 3 years.  X-rays showed that he has a herniated disk  at the level of T4 extraforaminal with stenosis at L4-5.  Surgery was  advised.  The patient will now wait to see how well he did.  Today, he  is worse.  Surgery was advised and the risk was explained in history and  physical.   PROCEDURE:  The patient was taken to the OR and after intubation, he was  positioned in prone manner.  The skin was cleaned with DuraPrep.  X-rays  showed we were at the level of L4-5.  From then on, we made a midline  incision from L4 down to L3-L4.  We retracted the muscle.  We did  another x-ray, one x-ray showed that we were at the left 2-3 and 3-4.  From then on, we dropped one level.  Because the main problem was 3-4,  we started doing the L4 laminotomy of 3-4 on the left side with  decompression of the L4 nerve root.  Then, we dropped extraforaminal  with the microscope and then we did remove the lateral aspect of the  facet.  The intertransverse ligament was also excised.  Immediately, we  found the L3 nerve root which was swollen with disk wrapping  around the  nerve.  It was mostly dorsal.  The patient had quite a bit of adhesion.  Lysis was accomplished.  Then, we removed little fragments and then we  entered the disk space and diskectomy from the extra to intraforaminal  punch was achieved.  At the level of 4-5, we did a laminotomy with  decompression of the L4 and L5 nerve root.  There was no need to do  diskectomy.  X-ray was repaired which showed that indeed we were at 3-4  and 4-5.  From then on, the area was irrigated.  Valsalva maneuver was  negative.  Then, the wound was closed with Vicryl and Steri-Strips.  Prior to that, we left fentanyl and Depo-Medrol in the epidural space.           ______________________________  Hilda Lias, M.D.     EB/MEDQ  D:  11/30/2007  T:  12/01/2007  Job:  [id number]

## 2010-10-25 NOTE — Procedures (Signed)
[name], [name]               ACCOUNT NO.:  [id number]   MEDICAL RECORD NO.:  [id number]          PATIENT TYPE:  REC   LOCATION:  TPC                          FACILITY:  [facility]   PHYSICIAN:  Erick Colace, M.D.DATE OF BIRTH:  [birth year]   DATE OF PROCEDURE:  DATE OF DISCHARGE:                                 OPERATIVE REPORT   PROCEDURE PERFORMED:  Bilateral L5 dorsal ramus injection, bilateral L4  medial branch block, bilateral L3 medial branch block under fluoroscopic  guidance.   INDICATIONS FOR PROCEDURE:  Lumbar pain, probable facet syndrome in setting  of congenital lumbar stenosis.  The pain is only partially relieved by oral  medications.   Informed consent was obtained after describing risks and benefits of the  procedure to the patient.  These include bleeding, bruising, infection, loss  of bowel and bladder function, temporary or permanent paralysis and he  elects to proceed and has given written consent.   Patient placed prone on fluoroscopy table.  Betadine prep and sterile drape.  A 25 gauge 1-1/2 inch needle was used to anesthetize skin and subcutaneous  tissue with 1% lidocaine at each of the six sites.  Then 22 gauge 3-1/2 inch  spinal needle was inserted under fluoroscopic guidance first targeting the  left S1 SAP sacral ala junction.  Bone contact made confirmed with lateral  imaging.  Omnipaque 180 0.5 mL demonstrated no intravascular uptake.  0.5 mL  of a Depo-Medrol lidocaine solution injected.  Then the right S1 SAP  transverse process junction targeted.  Bone contact made  confirmed with  lateral imaging.  Omnipaque 180 x 0.5 mL demonstrated no intravascular  uptake and 0.5 mL of a Depo-Medrol lidocaine solution injected, then the  right L5 SAP transverse process junction targeted, bone contact made,  confirmed with lateral imaging.  Omnipaque 180 x 0.5 mL demonstrated no  intravascular uptake and 0.5 mL of Depo-Medrol lidocaine solution was  injected and the right L4 SAP transverse process junction targeted.  Bone  contact made confirmed with lateral imaging.  Omnipaque 180 x 0.5 mL  demonstrated no intravascular uptake and 0.5 mL of Depo-Medrol  lidocaine  solution injected and left L4 SAP transverse process junction targeted, bone  contact made, confirmed with lateral imaging.  Omnipaque 180 x 0.5 mL  demonstrated no intravascular uptake and 0.5 mL of Depo-Medrol lidocaine  solution was injected.  The patient tolerated the procedure well.  Pre and  post injection vital signs stable.  He was given a pain diary and will  follow up  in one month for possible reinjection followed by outpatient physical  therapy.  Will continue Ultram but switch to ER for formulation as well as  continue Lyrica.      Erick Colace, M.D.  Electronically Signed     AEK/MEDQ  D:  12/22/2005 13:24:25  T:  12/22/2005 14:36:52  Job:  [id number]   cc:   Hilda Lias, M.D.  Fax: [phone]   Mila Homer. Sudie Bailey, M.D.  Fax: [phone]

## 2010-10-25 NOTE — Assessment & Plan Note (Signed)
The patient is a 45 year old male who I last saw 12/22/05.  He has a  history of lumbar facet syndrome.  He had good relief from facet medial  branch block, in fact, prolonged relief, around 6 months or so.  He has  had increasing back pain once again and, therefore, is here for re-  evaluation.  He has had new imaging studies.  He a CT myelogram  approximately 1 year ago showing congenitally shortened pedicles left L3-  4, recess stenosis, bilateral L4-5 foraminal stenosis and has had  degenerative changes at L3-S1.  He has had a prior EMG done September 26, 2005, which was normal.   CURRENT MEDICATIONS:  1. Tylox 1 p.o. b.i.d.  2. He is on Simvastatin 80 daily.  3. Over-the-counter Advil.  When he was followed by this clinic he was trialed on Ultram ER, which  did help him, 200 mg dose per day.  He was also on Lyrica 150 b.i.d.   His pain is averaging 5/10, currently 3/10, interferes with activity at  a moderate level, i.e., 4/10.  Sleep is  poor.  He can walk 30 minutes  at a time.  He climbs steps.  He does not drive.  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   REVIEW OF SYSTEMS:  Positive for numbness, tingling, spasms, anxiety.  He has had some weight gain.  His main complaint at this time is left-  sided lower back pain as well as left lower extremity pain.   SOCIAL HISTORY:  Single, living with his mother.  History of DUI.  Smokes a pack-per-day.  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   VITAL SIGNS:  Blood pressure 136/85, pulse 76, respiratory rate 16, O2  sat 99% on room air.  GENERAL:  No acute distress.  Oriented x3.  His right lower gait is  normal.  2+  patella reflexes bilaterally, right Achilles 2+, left  Achilles 1+.  Negative straight leg raising test.  Normal strength  bilateral lower extremities.  Normal gait and spine range of motion 75%  forward flexion, 50% extension.  Extension is more painful than flexion.   IMPRESSION:   Lumbar facet arthropathy.  Appears to be some recurrence.  He may have some radiculopathy as well.   PLAN:  Given he had a very good and prolonged response to facet medial  branch blocks, we will repeat this.  If this is not as helpful as prior,  would consider lumbar transforaminal epidural steroid injection at L5,  S1.  1. May need to retart Ultram ER and Lyrica      [name], M.D.  Electronically Signed     AEK/MedQ  D:  10/15/2006 14:45:53  T:  10/15/2006 15:55:55  Job #:  [id number]   cc:   Hilda Lias, M.D.  Fax: [phone]

## 2010-10-25 NOTE — Procedures (Signed)
NAMEJAIMIN, [name]               ACCOUNT NO.:  [id number]   MEDICAL RECORD NO.:  [id number]          PATIENT TYPE:  REC   LOCATION:  TPC                          FACILITY:  [facility]   PHYSICIAN:  Erick Colace, M.D.DATE OF BIRTH:  1965-12-28   DATE OF PROCEDURE:  10/27/2006  DATE OF DISCHARGE:                               OPERATIVE REPORT   PROCEDURE:  Bilateral L5 dorsal ramus injection, bilateral L4 medial  branch, bilateral L3 medial branch block under fluoroscopic guidance.   INDICATIONS:  Lumbar facet mediated pain demonstrated by prolonged  effect from prior medial branch block done on December 22, 2005.  His pain  has gradually recurred over the course of the last several months and,  despite medications, has persisted.   DESCRIPTION OF PROCEDURE:  Informed consent was obtained after  describing risks and benefits of the procedure to the patient.  These  include bleeding, bruising, infection, loss of bowel or bladder  function, temporary or permanent paralysis, he elects to proceed and has  given written consent. The patient was placed prone on the fluoroscopy  table.  Betadine and sterile drape was applied.  A 25 gauge 1 1/2 inch  needle was used to anesthetize the skin and subcu tissue with 1%  lidocaine x 3 mL at each site. Then, a 22 gauge 3.5 inch spinal needle  was inserted under fluoroscopic guidance, first targeting the left S1  SAP sacral alar junction, bone contact made confirmed with lateral  imaging.  Then, Omnipaque 180 x 0.5 mL demonstrated with no  intravascular uptake and 0.5 mL of a solution containing 1 mL of 4 mg  per mL dexamethasone and 2 mL of 2% lidocaine were injected.  Next, left  L5 SAP transverse junction target, bone contact made, confirmed with  lateral imaging.  Omnipaque 180 x 0.5 mL demonstrated no intravascular  uptake and 0.5 mL of then dexamethasone lidocaine solution injected.  Then, the left L4 SAP transverse junction targeted, bone  contact made,  confirmed with lateral imaging.  Omnipaque 180 x 0.5 mL demonstrated no  intravascular uptake. Then, the same procedure was repeated on the right  side using the same technique, equipment, and injectate.  The patient  tolerated the procedure well.  Pre injection pain level 6/10.  Post  injection pain level 0/10.  Pre and post injection vitals stable.  Post  injection instructions given.      Erick Colace, M.D.  Electronically Signed     AEK/MEDQ  D:  10/27/2006 14:34:28  T:  10/27/2006 16:19:05  Job:  [id number]

## 2010-10-25 NOTE — Group Therapy Note (Signed)
REFERRING PHYSICIAN:  Hilda Lias, M.D.   PRIMARY CARE PHYSICIAN:  Mila Homer. Sudie Bailey, M.D.   REASON FOR VISIT:  Evaluation of low back and left lower extremity pain.   HISTORY:  A 45 year old male who has a five-year history of gradual onset of  back pain.  No history of injury.  He has not required any hospitalization,  injections, physical therapy and has been treated by his primary care  physician as well as more recently neurosurgery.  Approximately two years  ago, he developed some pain shooting down from his left hip to his left  calf.  In the last four to five months, he has developed left lower  extremity numbness sensation.  He states his pain is 5/10.  His activity has  been at about a 3/10 level, mainly daytime and evening pain.  Sleep is fair.  He states that digging a hole at work seemed to flare things up.  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  Does some lifting.  Uses hand truck.   REVIEW OF SYSTEMS:  Positive for numbness and tingling left lower extremity,  sparing the foot.   PAST MEDICAL HISTORY:  Significant for high blood pressure.   FAMILY HISTORY:  Sister with polymyositis.  Also has a family with diabetes  and high blood pressure.   SOCIAL HISTORY:  Lives with his mother.  Single.  DUI conviction and states  that he smokes marijuana on occasion and most recently at a birthday party.   PHYSICAL EXAMINATION:  Blood pressure is 162/107, pulse 87, respiratory rate  16, O2 saturation 100% on room air.  His gait is normal.  No evidence of toe  drag or knee instability.  He is able to toe walk and heel walk.  He has  only minimal pain with bending forward.  In fact, he can touch his ankles  easily.  His right calf at 10 inches below the patella is 39.5 cm, left is  39.  His right thigh at the level of VMO superior patellar pole 43  bilaterally.   His sensation is intact to light touch bilaterally.  The deep tendon  reflexes are normal  bilateral lower extremities.  He has normal range of  motion in hips, knees and ankles.   He has good upper extremity strength.  His back has pain with hyperextension  in a prone position.  He has negative Faber's maneuver.   REVIEW OF IMAGING STUDIES:  CT myelogram September 30, 2005 demonstrates short  pedicles, left L3/4 lateral recess stenosis, bilateral L4/5 foraminal  stenosis.  Some impingement noted at L3 but not at L4.  He is also noted to  have L3 through S1 facet degenerative changes.  He also had a MRI of the  lumbosacral spine, ordered by his primary physician on August 05, 2005,  demonstrating the above but also showing some L4 impingement on the left.   EMG study done per Dr. Thad Ranger, September 26, 2005, was normal.   IMPRESSION:  1.  Low back pain likely secondary to lumbar facet syndrome.  2.  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  3.  Foraminal stenosis related to facet degenerative changes, short pedicle      and mild disc bulges at these levels.   PLAN:  1.  Given his history of marijuana use as well as DUI, would like to try him  on non-narcotic management.  Switch him to Ultram from his oxycodone.  2.  Continue ibuprofen as an anti-inflammatory.  3.  Set him up for lumbar facet injections.  4.  Follow facet injections with a physical therapy course emphasizing      flexion exercises.  5.  Continue his current work without any types of restrictions.  6.  Lyrica for radicular pain.  Start out 75 b.i.d. samples and go up to 150      b.i.d.   Should his radicular discomfort become more bothersome than his back pain,  would consider epidural injections transforaminal approach.      Erick Colace, M.D.  Electronically Signed     AEK/MedQ  D:  11/24/2005 09:54:13  T:  11/25/2005 01:07:55  Job #:  [id number]   cc:   Hilda Lias, M.D.  Fax: [phone]   Mila Homer. Sudie Bailey,  M.D.  Fax: [phone]

## 2010-12-10 ENCOUNTER — Observation Stay (HOSPITAL_COMMUNITY)
Admission: EM | Admit: 2010-12-10 | Discharge: 2010-12-13 | Disposition: A | Discharge status: Home / Self Care | Payer: Medicare Other | Attending: General Surgery | Admitting: General Surgery

## 2010-12-10 ENCOUNTER — Observation Stay (HOSPITAL_COMMUNITY): Payer: Medicare Other

## 2010-12-10 ENCOUNTER — Emergency Department (HOSPITAL_COMMUNITY): Payer: Medicare Other

## 2010-12-10 DIAGNOSIS — J449 Chronic obstructive pulmonary disease, unspecified: Secondary | ICD-10-CM | POA: Insufficient documentation

## 2010-12-10 DIAGNOSIS — R51 Headache: Secondary | ICD-10-CM | POA: Insufficient documentation

## 2010-12-10 DIAGNOSIS — Z7901 Long term (current) use of anticoagulants: Secondary | ICD-10-CM | POA: Insufficient documentation

## 2010-12-10 DIAGNOSIS — Z86718 Personal history of other venous thrombosis and embolism: Secondary | ICD-10-CM | POA: Insufficient documentation

## 2010-12-10 DIAGNOSIS — R198 Other specified symptoms and signs involving the digestive system and abdomen: Secondary | ICD-10-CM | POA: Insufficient documentation

## 2010-12-10 DIAGNOSIS — J4489 Other specified chronic obstructive pulmonary disease: Secondary | ICD-10-CM | POA: Insufficient documentation

## 2010-12-10 DIAGNOSIS — Z23 Encounter for immunization: Secondary | ICD-10-CM | POA: Insufficient documentation

## 2010-12-10 DIAGNOSIS — Z79899 Other long term (current) drug therapy: Secondary | ICD-10-CM | POA: Insufficient documentation

## 2010-12-10 DIAGNOSIS — S270XXA Traumatic pneumothorax, initial encounter: Secondary | ICD-10-CM | POA: Insufficient documentation

## 2010-12-10 DIAGNOSIS — S2239XA Fracture of one rib, unspecified side, initial encounter for closed fracture: Secondary | ICD-10-CM | POA: Insufficient documentation

## 2010-12-10 DIAGNOSIS — S42209A Unspecified fracture of upper end of unspecified humerus, initial encounter for closed fracture: Principal | ICD-10-CM | POA: Insufficient documentation

## 2010-12-10 LAB — URINALYSIS, ROUTINE W REFLEX MICROSCOPIC
Glucose, UA: NEGATIVE mg/dL
Leukocytes, UA: NEGATIVE
Nitrite: NEGATIVE
Protein, ur: NEGATIVE mg/dL
pH: 6.5 (ref 5.0–8.0)

## 2010-12-10 LAB — CBC
HCT: 41.2 % (ref 39.0–52.0)
Hemoglobin: 13.6 g/dL (ref 13.0–17.0)
RBC: 4.84 MIL/uL (ref 4.22–5.81)
WBC: 11.6 10*3/uL — ABNORMAL HIGH (ref 4.0–10.5)

## 2010-12-10 LAB — APTT: aPTT: 33 seconds (ref 24–37)

## 2010-12-10 LAB — TYPE AND SCREEN: Antibody Screen: NEGATIVE

## 2010-12-10 LAB — URINE MICROSCOPIC-ADD ON

## 2010-12-10 LAB — BASIC METABOLIC PANEL
CO2: 26 mEq/L (ref 19–32)
Chloride: 100 mEq/L (ref 96–112)
Glucose, Bld: 111 mg/dL — ABNORMAL HIGH (ref 70–99)
Potassium: 3.5 mEq/L (ref 3.5–5.1)
Sodium: 132 mEq/L — ABNORMAL LOW (ref 135–145)

## 2010-12-10 LAB — DIFFERENTIAL
Basophils Absolute: 0 10*3/uL (ref 0.0–0.1)
Lymphocytes Relative: 12 % (ref 12–46)
Monocytes Absolute: 0.7 10*3/uL (ref 0.1–1.0)
Neutro Abs: 9.4 10*3/uL — ABNORMAL HIGH (ref 1.7–7.7)
Neutrophils Relative %: 81 % — ABNORMAL HIGH (ref 43–77)

## 2010-12-10 LAB — RAPID URINE DRUG SCREEN, HOSP PERFORMED
Amphetamines: NOT DETECTED
Barbiturates: NOT DETECTED

## 2010-12-10 LAB — PROTIME-INR: INR: 1.24 (ref 0.00–1.49)

## 2010-12-10 MED ORDER — IOHEXOL 300 MG/ML  SOLN: 100.0000 mL | Freq: Once | INTRAMUSCULAR | Status: AC | PRN

## 2010-12-11 ENCOUNTER — Observation Stay (HOSPITAL_COMMUNITY): Payer: Medicare Other

## 2010-12-12 ENCOUNTER — Observation Stay (HOSPITAL_COMMUNITY): Payer: Medicare Other

## 2010-12-13 ENCOUNTER — Observation Stay (HOSPITAL_COMMUNITY): Payer: Medicare Other

## 2010-12-25 ENCOUNTER — Other Ambulatory Visit (HOSPITAL_COMMUNITY): Payer: Self-pay | Admitting: Orthopedic Surgery

## 2010-12-25 ENCOUNTER — Ambulatory Visit (HOSPITAL_COMMUNITY)
Admission: RE | Admit: 2010-12-25 | Discharge: 2010-12-25 | Disposition: A | Discharge status: Home / Self Care | Payer: Medicare Other | Source: Ambulatory Visit | Attending: Orthopedic Surgery | Admitting: Orthopedic Surgery

## 2010-12-25 DIAGNOSIS — S42213A Unspecified displaced fracture of surgical neck of unspecified humerus, initial encounter for closed fracture: Secondary | ICD-10-CM | POA: Insufficient documentation

## 2010-12-25 DIAGNOSIS — R52 Pain, unspecified: Secondary | ICD-10-CM

## 2010-12-25 DIAGNOSIS — M25519 Pain in unspecified shoulder: Secondary | ICD-10-CM | POA: Insufficient documentation
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
## 2010-12-26 NOTE — Progress Notes (Signed)
  NAMEYOSHIAKI, [name] NO.:  [id number]  MEDICAL RECORD NO.:  [id number]  LOCATION:  A309                          FACILITY:  [facility]  PHYSICIAN:  Mila Homer. Sudie Bailey, M.D.DATE OF BIRTH:  08/21/1965  DATE OF PROCEDURE: DATE OF DISCHARGE:                                PROGRESS NOTE   SUBJECTIVE:  He is generally feeling better.  OBJECTIVE:  Temperature 98.1, pulse 89, respiratory rate  18, blood pressure 102/66.  He is supine in bed.  No acute distress.  He still has bruising of the right shoulder.  His lungs are clear throughout and his heart has a regular rhythm rate of about 90.  ABDOMEN:  Soft without organomegaly or mass or tenderness.  O2 saturations  94% on one and a half liters.  ASSESSMENT: 1. Right-sided pneumothorax secondary to right rib fracture. 2. Chronic obstructive pulmonary disease with upper lobe bullae. 3. Displaced fracture of the right humerus. 4. Chronic low back pain. 5. Addictive personality. 6. Anticoagulation.  PLAN:  He may be able to go home today.  When he does,  he may be on pain medication through either Dr. Leticia Penna, who admitted him, or Dr. Hilda Lias, the orthopedist who saw his right shoulder.  He will be taking any prescriptions to [facility], his pharmacy,  and will be sure not to overlap on his pain medication.  Long-term he will need to be off pain medication and we again discussed the other items like the use of cocaine, marijuana, etc.     Mila Homer. Sudie Bailey, M.D.     SDK/MEDQ  D:  12/13/2010  T:  12/13/2010  Job:  [id number]  Electronically Signed by John Giovanni M.D. on 12/26/2010 04:49:06 PM

## 2011-01-05 ENCOUNTER — Encounter: Payer: Self-pay | Admitting: *Deleted

## 2011-01-05 DIAGNOSIS — R110 Nausea: Secondary | ICD-10-CM | POA: Insufficient documentation

## 2011-01-05 DIAGNOSIS — F1920 Other psychoactive substance dependence, uncomplicated: Secondary | ICD-10-CM | POA: Insufficient documentation

## 2011-01-05 DIAGNOSIS — F17200 Nicotine dependence, unspecified, uncomplicated: Secondary | ICD-10-CM | POA: Insufficient documentation

## 2011-01-05 NOTE — ED Notes (Signed)
States he has  Been on narcotics for over 6 years, states his last dose of meds was yesterday , requesting detox

## 2011-01-06 ENCOUNTER — Emergency Department (HOSPITAL_COMMUNITY)
Admission: EM | Admit: 2011-01-06 | Discharge: 2011-01-06 | Disposition: A | Discharge status: Home / Self Care | Payer: Medicare Other | Source: Home / Self Care | Attending: Emergency Medicine | Admitting: Emergency Medicine

## 2011-01-06 DIAGNOSIS — F1910 Other psychoactive substance abuse, uncomplicated: Secondary | ICD-10-CM

## 2011-01-06 HISTORY — DX: Fracture of one rib, unspecified side, initial encounter for closed fracture: S22.39XA

## 2011-01-06 HISTORY — DX: Multiple fractures of ribs, unspecified side, initial encounter for closed fracture: S22.49XA

## 2011-01-06 HISTORY — DX: Dorsalgia, unspecified: M54.9

## 2011-01-06 HISTORY — DX: Unspecified fracture of shaft of humerus, unspecified arm, initial encounter for closed fracture: S42.309A

## 2011-01-06 HISTORY — DX: Other psychoactive substance abuse, uncomplicated: F19.10

## 2011-01-06 LAB — BASIC METABOLIC PANEL
Calcium: 9 mg/dL (ref 8.4–10.5)
Chloride: 102 mEq/L (ref 96–112)
Creatinine, Ser: 0.85 mg/dL (ref 0.50–1.35)
GFR calc Af Amer: >60 mL/min (ref >60)
GFR calc non Af Amer: >60 mL/min (ref >60)

## 2011-01-06 LAB — CBC
MCHC: 33.1 g/dL (ref 30.0–36.0)
MCV: 86 fL (ref 78.0–100.0)
Platelets: 202 10*3/uL (ref 150–400)
RDW: 15.6 % — ABNORMAL HIGH (ref 11.5–15.5)
WBC: 8 10*3/uL (ref 4.0–10.5)

## 2011-01-06 LAB — ETHANOL: Alcohol, Ethyl (B): <11 mg/dL (ref 0–11)

## 2011-01-06 LAB — RAPID URINE DRUG SCREEN, HOSP PERFORMED: Benzodiazepines: POSITIVE — AB

## 2011-01-06 MED ORDER — ACETAMINOPHEN 325 MG PO TABS
ORAL_TABLET | ORAL | Status: AC
  Administered 2011-01-06: 03:00:00
  Filled 2011-01-06: qty 2

## 2011-01-06 MED ORDER — ACETAMINOPHEN 325 MG PO TABS: 650.0000 mg | ORAL_TABLET | ORAL | Status: DC | PRN

## 2011-01-06 MED ORDER — NICOTINE 21 MG/24HR TD PT24
21.0000 mg | MEDICATED_PATCH | Freq: Every day | TRANSDERMAL | Status: DC
  Administered 2011-01-06: 21 mg via TRANSDERMAL
  Filled 2011-01-06: qty 1

## 2011-01-06 MED ORDER — LORAZEPAM 1 MG PO TABS: 1.0000 mg | ORAL_TABLET | Freq: Three times a day (TID) | ORAL | Status: DC | PRN

## 2011-01-06 MED ORDER — ZOLPIDEM TARTRATE 5 MG PO TABS: 5.0000 mg | ORAL_TABLET | Freq: Every evening | ORAL | Status: DC | PRN

## 2011-01-06 MED ORDER — IBUPROFEN 400 MG PO TABS
600.0000 mg | ORAL_TABLET | Freq: Three times a day (TID) | ORAL | Status: DC | PRN
  Administered 2011-01-06: 600 mg via ORAL
  Filled 2011-01-06: qty 2

## 2011-01-06 MED ORDER — ACETAMINOPHEN 325 MG PO TABS
650.0000 mg | ORAL_TABLET | Freq: Once | ORAL | Status: AC
  Administered 2011-01-06: 325 mg via ORAL

## 2011-01-06 NOTE — ED Provider Notes (Signed)
History     Chief Complaint  Patient presents with  . Addiction Problem   HPI  Past Medical History  Diagnosis Date  . Substance abuse   . Back pain   . Broken arm   . Broken ribs     Past Surgical History  Procedure Date  . Back surgery     History reviewed. No pertinent family history.  History  Substance Use Topics  . Smoking status: Current Everyday Smoker -- 1.0 packs/day  . Smokeless tobacco: Not on file  . Alcohol Use:       Review of Systems  Physical Exam  BP 118/79  Pulse 97  Temp(Src) 98.2 F (36.8 C) (Oral)  Resp 16  Ht 6\' 3"  (1.905 m)  Wt 230 lb (104.327 kg)  BMI 28.75 kg/m2  SpO2 97%  Physical Exam  ED Course  Procedures  MDM SEEN BY [facility] TEAM AND EVALED GIVEN REFERRALS FOR SUBSTANCE ABUSE. DOSE NOT NEED INPATIENT.       Shelda Jakes, MD 01/06/11 [phone]

## 2011-01-06 NOTE — ED Notes (Signed)
Pt alert and oriented x 3. Skin warm and dry. Color pink. Pt sitting on the side of the stretcher eating breakfast. Medicated po for c/o pain. Ambulating to bathroom as needed. Denies suicidal ideations at this time. States that he needs help with detox.

## 2011-01-06 NOTE — ED Notes (Signed)
Pt states that he is in really bad pain and needs some pain medicine. Pt notified that it had not been enough time to give him more meds. Pt states that he knows he is here for detox but that he is in bad pain.

## 2011-01-06 NOTE — ED Notes (Signed)
Pt sleeping at intervals but arouses easily to verbal stimuli. Pt still c/o pain in his rt arm. Pt waiting for disposition. Ate 2/3 of his breakfast tray.

## 2011-01-06 NOTE — ED Provider Notes (Signed)
History     Chief Complaint  Patient presents with  . Addiction Problem   HPI Comments: Patient wants help with opiate and cocaine addiction. He states he has chronic pain and has been on opiods for a long time. Now using more and using cocaine. He does not drink.Has no h/o having gone through detox in the past.  Patient is a 45 y.o. male presenting with drug/alcohol assessment. The history is provided by the patient.  Drug / Alcohol Assessment This is a chronic problem. Episode onset: months. The problem has not changed since onset.Suspected agents include cocaine and opiates. Associated symptoms include nausea. Associated medical issues include addiction treatment.    Past Medical History  Diagnosis Date  . Substance abuse   . Back pain   . Broken arm   . Broken ribs     Past Surgical History  Procedure Date  . Back surgery     History reviewed. No pertinent family history.  History  Substance Use Topics  . Smoking status: Current Everyday Smoker -- 1.0 packs/day  . Smokeless tobacco: Not on file  . Alcohol Use:       Review of Systems  Gastrointestinal: Positive for nausea.  All other systems reviewed and are negative.    Physical Exam  BP 135/106  Pulse 113  Temp(Src) 98.2 F (36.8 C) (Oral)  Resp 20  Ht 6\' 3"  (1.905 m)  Wt 230 lb (104.327 kg)  BMI 28.75 kg/m2  SpO2 100%  Physical Exam  Nursing note and vitals reviewed. Constitutional: He appears well-developed and well-nourished.  HENT:  Head: Normocephalic.  Right Ear: External ear normal.  Left Ear: External ear normal.  Mouth/Throat: Oropharynx is clear and moist.  Eyes: EOM are normal. Pupils are equal, round, and reactive to light.  Neck: Normal range of motion.  Cardiovascular: Normal rate, normal heart sounds and intact distal pulses.   Pulmonary/Chest: Effort normal and breath sounds normal.  Abdominal: Soft.  Musculoskeletal:       Right arm in sling from previous fracture.  Skin:  Skin is warm and dry.  Psychiatric: He has a normal mood and affect. His behavior is normal. Judgment and thought content normal.    ED Course  Procedures  MDM Patient / Family / Caregiver understand and agree with initial ED impression and plan with expectations set for ED visit.Spoke with [facility], Hattie Perch who advised patient would be a candidate for [facility]. No beds tonight but expect to have multiple beds tomorrow. Will have him assessed by [facility] for placement.      Nicoletta Dress. Colon Branch, MD 01/06/11 1610

## 2011-01-06 NOTE — ED Notes (Signed)
Pt wants detox form drugs and alcohol.  md at bedside. For exam

## 2011-01-06 NOTE — ED Notes (Signed)
Pt requested meds for pain, md notified and meds ordered and given, pt talking on cell w/ friend

## 2011-01-06 NOTE — ED Notes (Signed)
Watching tv in bed, voices no complaints

## 2011-01-06 NOTE — ED Notes (Signed)
Pt given toothbrush and toothpaste to brush his teeth. States that he is going to call his mother to bring him some clothes so he can take a shower. Paper scrubs offered but patient declined at this time.

## 2011-01-08 ENCOUNTER — Inpatient Hospital Stay (HOSPITAL_COMMUNITY)
Admission: RE | Admit: 2011-01-08 | Discharge: 2011-01-11 | DRG: 897 | Disposition: A | Discharge status: Home / Self Care | Payer: Medicare Other | Attending: Psychiatry | Admitting: Psychiatry

## 2011-01-08 DIAGNOSIS — R45851 Suicidal ideations: Secondary | ICD-10-CM

## 2011-01-08 DIAGNOSIS — Z981 Arthrodesis status: Secondary | ICD-10-CM

## 2011-01-08 DIAGNOSIS — S2239XA Fracture of one rib, unspecified side, initial encounter for closed fracture: Secondary | ICD-10-CM | POA: Diagnosis present

## 2011-01-08 DIAGNOSIS — F17200 Nicotine dependence, unspecified, uncomplicated: Secondary | ICD-10-CM

## 2011-01-08 DIAGNOSIS — S42309A Unspecified fracture of shaft of humerus, unspecified arm, initial encounter for closed fracture: Secondary | ICD-10-CM | POA: Diagnosis present

## 2011-01-08 DIAGNOSIS — Z79899 Other long term (current) drug therapy: Secondary | ICD-10-CM

## 2011-01-08 DIAGNOSIS — R110 Nausea: Secondary | ICD-10-CM

## 2011-01-08 DIAGNOSIS — F329 Major depressive disorder, single episode, unspecified: Secondary | ICD-10-CM

## 2011-01-08 DIAGNOSIS — F3289 Other specified depressive episodes: Secondary | ICD-10-CM

## 2011-01-08 DIAGNOSIS — K219 Gastro-esophageal reflux disease without esophagitis: Secondary | ICD-10-CM

## 2011-01-08 DIAGNOSIS — Z7901 Long term (current) use of anticoagulants: Secondary | ICD-10-CM

## 2011-01-08 DIAGNOSIS — F1920 Other psychoactive substance dependence, uncomplicated: Principal | ICD-10-CM

## 2011-01-08 DIAGNOSIS — M549 Dorsalgia, unspecified: Secondary | ICD-10-CM

## 2011-01-08 DIAGNOSIS — G8929 Other chronic pain: Secondary | ICD-10-CM

## 2011-01-08 LAB — COMPREHENSIVE METABOLIC PANEL
ALT: 111 U/L — ABNORMAL HIGH (ref 0–53)
AST: 63 U/L — ABNORMAL HIGH (ref 0–37)
Alkaline Phosphatase: 157 U/L — ABNORMAL HIGH (ref 39–117)
CO2: 27 mEq/L (ref 19–32)
Chloride: 103 mEq/L (ref 96–112)
GFR calc Af Amer: >60 mL/min (ref >60)
GFR calc non Af Amer: >60 mL/min (ref >60)
Glucose, Bld: 88 mg/dL (ref 70–99)
Potassium: 3.9 mEq/L (ref 3.5–5.1)
Sodium: 138 mEq/L (ref 135–145)

## 2011-01-08 LAB — CBC
HCT: 43.2 % (ref 39.0–52.0)
Hemoglobin: 14.1 g/dL (ref 13.0–17.0)
MCV: 86.2 fL (ref 78.0–100.0)
RBC: 5.01 MIL/uL (ref 4.22–5.81)
WBC: 7.8 10*3/uL (ref 4.0–10.5)

## 2011-01-08 LAB — PROTIME-INR: INR: 1.12 (ref 0.00–1.49)

## 2011-01-09 DIAGNOSIS — F1920 Other psychoactive substance dependence, uncomplicated: Secondary | ICD-10-CM

## 2011-01-11 LAB — PROTIME-INR: INR: 1.54 — ABNORMAL HIGH (ref 0.00–1.49)
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
## 2011-01-31 ENCOUNTER — Other Ambulatory Visit (HOSPITAL_COMMUNITY): Payer: Medicare Other

## 2011-02-04 ENCOUNTER — Ambulatory Visit (HOSPITAL_COMMUNITY)
Admission: RE | Admit: 2011-02-04 | Discharge: 2011-02-04 | Disposition: A | Discharge status: Home / Self Care | Payer: Medicare Other | Source: Ambulatory Visit | Attending: Orthopedic Surgery | Admitting: Orthopedic Surgery

## 2011-02-04 ENCOUNTER — Encounter (HOSPITAL_COMMUNITY)
Admission: RE | Admit: 2011-02-04 | Discharge: 2011-02-04 | Disposition: A | Discharge status: Home / Self Care | Payer: Medicare Other | Source: Ambulatory Visit | Attending: Orthopedic Surgery | Admitting: Orthopedic Surgery

## 2011-02-04 ENCOUNTER — Other Ambulatory Visit (HOSPITAL_COMMUNITY): Payer: Self-pay | Admitting: Orthopedic Surgery

## 2011-02-04 DIAGNOSIS — I10 Essential (primary) hypertension: Secondary | ICD-10-CM

## 2011-02-04 DIAGNOSIS — Z01810 Encounter for preprocedural cardiovascular examination: Secondary | ICD-10-CM | POA: Insufficient documentation

## 2011-02-04 DIAGNOSIS — Z01812 Encounter for preprocedural laboratory examination: Secondary | ICD-10-CM | POA: Insufficient documentation

## 2011-02-04 DIAGNOSIS — Z01811 Encounter for preprocedural respiratory examination: Secondary | ICD-10-CM | POA: Insufficient documentation

## 2011-02-04 LAB — SURGICAL PCR SCREEN
MRSA, PCR: NEGATIVE
Staphylococcus aureus: POSITIVE — AB

## 2011-02-04 LAB — CBC
Hemoglobin: 16.8 g/dL (ref 13.0–17.0)
MCH: 29.6 pg (ref 26.0–34.0)
MCV: 87.1 fL (ref 78.0–100.0)
Platelets: 232 10*3/uL (ref 150–400)
RBC: 5.67 MIL/uL (ref 4.22–5.81)
WBC: 9.2 10*3/uL (ref 4.0–10.5)

## 2011-02-04 LAB — DIFFERENTIAL
Basophils Relative: 0 % (ref 0–1)
Eosinophils Absolute: 0.3 10*3/uL (ref 0.0–0.7)
Lymphs Abs: 2.7 10*3/uL (ref 0.7–4.0)
Monocytes Relative: 9 % (ref 3–12)
Neutro Abs: 5.4 10*3/uL (ref 1.7–7.7)
Neutrophils Relative %: 59 % (ref 43–77)

## 2011-02-04 LAB — BASIC METABOLIC PANEL
CO2: 25 mEq/L (ref 19–32)
Calcium: 9.9 mg/dL (ref 8.4–10.5)
Potassium: 4.8 mEq/L (ref 3.5–5.1)
Sodium: 137 mEq/L (ref 135–145)

## 2011-02-04 LAB — URINALYSIS, ROUTINE W REFLEX MICROSCOPIC
Glucose, UA: NEGATIVE mg/dL
Leukocytes, UA: NEGATIVE
Protein, ur: NEGATIVE mg/dL
Specific Gravity, Urine: 1.01 (ref 1.005–1.030)
pH: 7 (ref 5.0–8.0)

## 2011-02-04 LAB — PROTIME-INR: Prothrombin Time: 14.4 seconds (ref 11.6–15.2)
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
## 2011-02-07 ENCOUNTER — Ambulatory Visit (HOSPITAL_COMMUNITY)
Admission: RE | Admit: 2011-02-07 | Discharge: 2011-02-08 | Disposition: A | Discharge status: Home / Self Care | Payer: Medicare Other | Source: Ambulatory Visit | Attending: Orthopedic Surgery | Admitting: Orthopedic Surgery

## 2011-02-07 ENCOUNTER — Ambulatory Visit (HOSPITAL_COMMUNITY): Payer: Medicare Other

## 2011-02-07 DIAGNOSIS — Z01810 Encounter for preprocedural cardiovascular examination: Secondary | ICD-10-CM | POA: Insufficient documentation

## 2011-02-07 DIAGNOSIS — S42213A Unspecified displaced fracture of surgical neck of unspecified humerus, initial encounter for closed fracture: Secondary | ICD-10-CM | POA: Insufficient documentation

## 2011-02-07 DIAGNOSIS — I10 Essential (primary) hypertension: Secondary | ICD-10-CM | POA: Insufficient documentation

## 2011-02-07 DIAGNOSIS — IMO0002 Reserved for concepts with insufficient information to code with codable children: Secondary | ICD-10-CM | POA: Insufficient documentation

## 2011-02-07 DIAGNOSIS — F329 Major depressive disorder, single episode, unspecified: Secondary | ICD-10-CM | POA: Insufficient documentation

## 2011-02-07 DIAGNOSIS — Z01812 Encounter for preprocedural laboratory examination: Secondary | ICD-10-CM | POA: Insufficient documentation

## 2011-02-07 DIAGNOSIS — Y998 Other external cause status: Secondary | ICD-10-CM | POA: Insufficient documentation

## 2011-02-07 DIAGNOSIS — F3289 Other specified depressive episodes: Secondary | ICD-10-CM | POA: Insufficient documentation

## 2011-02-07 DIAGNOSIS — Z01818 Encounter for other preprocedural examination: Secondary | ICD-10-CM | POA: Insufficient documentation

## 2011-02-07 DIAGNOSIS — F17200 Nicotine dependence, unspecified, uncomplicated: Secondary | ICD-10-CM | POA: Insufficient documentation

## 2011-02-07 DIAGNOSIS — G8929 Other chronic pain: Secondary | ICD-10-CM | POA: Insufficient documentation

## 2011-02-07 LAB — PROTIME-INR: INR: 0.91 (ref 0.00–1.49)

## 2011-02-08 LAB — BASIC METABOLIC PANEL
CO2: 29 mEq/L (ref 19–32)
Calcium: 8.7 mg/dL (ref 8.4–10.5)
Creatinine, Ser: 0.8 mg/dL (ref 0.50–1.35)
Glucose, Bld: 105 mg/dL — ABNORMAL HIGH (ref 70–99)

## 2011-02-08 LAB — CBC
HCT: 43.3 % (ref 39.0–52.0)
MCHC: 32.8 g/dL (ref 30.0–36.0)
MCV: 88.2 fL (ref 78.0–100.0)
RDW: 15 % (ref 11.5–15.5)

## 2011-02-08 LAB — PROTIME-INR: INR: 1.02 (ref 0.00–1.49)

## 2011-02-18 NOTE — Op Note (Signed)
NAMEGEVIN, [name] NO.:  [id number]  MEDICAL RECORD NO.:  [id number]  LOCATION:  SDSC                         FACILITY:  [facility]  PHYSICIAN:  Almedia Balls. Ranell Patrick, M.D. 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  PROCEDURE PERFORMED:  Right shoulder nonunion takedown with open reduction internal fixation of right proximal humerus fracture using DePuy SNP with local bone grafting and allograft supplementation.  ATTENDING SURGEON:  Almedia Balls. Ranell Patrick, MD  ASSISTANT:  Donnie Coffin. Dixon, PA-C  ANESTHESIA:  General anesthesia was used plus interscalene block.  ESTIMATED BLOOD LOSS:  200 mL.  FLUID REPLACEMENT:  2000 mL crystalloid.  INSTRUMENT COUNT:  Correct.  COMPLICATIONS:  There were no complications.  PERIOPERATIVE ANTIBIOTICS:  Given.  INDICATIONS:  The patient is a 45 year old male with a history of right proximal humerus fracture sustained several months ago.  The patient initially was treated in a sling up in the [place] area.  The patient eventually was transported down to orthopedic evaluation for persistent shoulder pain and swelling and loss of function.  He was up evaluate and identified to have a 100%displaced proximal humerus fracture.  We discussed options for treatment.  The patient is referred over to Dr. Myrene Galas.  Initially, the patient was planning for a conservative management and hoping that the fracture would heal even in a poor position and then it would be functional and would not hurt. Subsequently, the decision was made to proceed with surgery.  The patient was scheduled for surgery by Dr. handy.  The surgery was then subsequently cancelled by Dr. Carola Frost.  The patient now represented  to [facility] with severe functional loss and pain in the right shoulder and 100% displacement as proximal fracture.  No evidence of bony union.  Having discussed with the patient options for treatment to include conservative treatment versus surgical management, the patient did elect to proceed with surgery.  Informed consent obtained.  DESCRIPTION OF PROCEDURE:  After adequate level of anesthesia achieved, the patient positioned to modified beach-chair position.  Right shoulder was sterilely prepped and draped in the usual manner.  The patient's shoulder contour was quite abnormal compared to the other shoulder with a flat anterior deltoid indicating post posterior humeral head translation relative to anterior shaft translation.  We went ahead and entered using a deltopectoral incision, starting to coracoid process, extending down to the anterior humeral shaft.  That was done with a 10- blade scalpel.  Dissection down through subcutaneous tissues using Bovie.  The deltopectoral interval was identified with the cephalic vein.  We took the cephalic vein laterally with deltoid and the pectoralis medially.  There was extensive scarring noted from that plane down.  We were able to develop the conjoined tendon and freed that up from the underlying subscapularis and underlying fractured humerus.  We had to essentially remove all of the fibrous tissue from the top of the humeral shaft and stuck back to the humeral head.  There was no bony union, but there was extensive fibrous nonunion present.  Once we had the subdeltoid plain freed up of the fracture plane freed up, it removed all the fibrous tissue that we could see.  We went ahead and attempted to reduce the humeral head on the humeral shaft.  It did take [place] quite a while to get what we felt was an acceptable reduction.  There was quite a bit of comminution from the fracture that we saw on his x-rays and noted intraoperatively.   We then developed humeral shaft, placed a DePuy SNP nail plate down the humeral shaft positioning it lateral to the bicipital groove.  We did go ahead and bring the C-arm and to verify reduction and then placed 5 smooth PEGS which were locked to the plate proximally, gaining good purchase and good position and then 3 unicortical screws into the plate locking into plate distally.  We arranged the shoulder replaced with the stability.  We went ahead and bone grafted the interval area between the head and the shaft up around the plate to further facilitate bony healing.  At this point, we thoroughly irrigated and then closed the deltopectoral interval with 0 Vicryl suture, followed by 2-0 Vicryl for subcutaneous closure and 4-0 Monocryl for skin.  Steri-Strips applied followed by a sterile dressing. The patient tolerated the surgery well.     Almedia Balls. Ranell Patrick, M.D.     SRN/MEDQ  D:  06/10/1999  T:  02/07/2011  Job:  [id number]  Electronically Signed by Malon Kindle  on 02/18/2011 03:09:33 AM

## 2011-03-06 LAB — CBC
MCHC: 33.9
MCV: 86.7
Platelets: 237

## 2011-03-10 LAB — BASIC METABOLIC PANEL
Calcium: 9.2
Creatinine, Ser: 0.96
GFR calc Af Amer: >60

## 2011-03-10 LAB — CBC
MCHC: 34
RBC: 4.99
WBC: 9

## 2011-03-11 ENCOUNTER — Encounter (HOSPITAL_COMMUNITY): Payer: Self-pay | Admitting: *Deleted

## 2011-03-11 ENCOUNTER — Emergency Department (HOSPITAL_COMMUNITY)
Admission: EM | Admit: 2011-03-11 | Discharge: 2011-03-12 | Disposition: A | Discharge status: Other Acute Inpatient Hospital | Payer: Medicare Other | Source: Home / Self Care | Attending: Emergency Medicine | Admitting: Emergency Medicine

## 2011-03-11 DIAGNOSIS — F3289 Other specified depressive episodes: Secondary | ICD-10-CM | POA: Insufficient documentation

## 2011-03-11 DIAGNOSIS — F329 Major depressive disorder, single episode, unspecified: Secondary | ICD-10-CM

## 2011-03-11 DIAGNOSIS — F17200 Nicotine dependence, unspecified, uncomplicated: Secondary | ICD-10-CM | POA: Insufficient documentation

## 2011-03-11 DIAGNOSIS — R45851 Suicidal ideations: Secondary | ICD-10-CM | POA: Insufficient documentation

## 2011-03-11 DIAGNOSIS — IMO0002 Reserved for concepts with insufficient information to code with codable children: Secondary | ICD-10-CM | POA: Insufficient documentation

## 2011-03-11 DIAGNOSIS — F1910 Other psychoactive substance abuse, uncomplicated: Secondary | ICD-10-CM | POA: Insufficient documentation

## 2011-03-11 LAB — DIFFERENTIAL
Eosinophils Relative: 2 % (ref 0–5)
Lymphocytes Relative: 27 % (ref 12–46)
Lymphs Abs: 2.4 10*3/uL (ref 0.7–4.0)
Neutro Abs: 5.9 10*3/uL (ref 1.7–7.7)

## 2011-03-11 LAB — RAPID URINE DRUG SCREEN, HOSP PERFORMED
Benzodiazepines: POSITIVE — AB
Cocaine: POSITIVE — AB
Opiates: NOT DETECTED

## 2011-03-11 LAB — BASIC METABOLIC PANEL
CO2: 29 mEq/L (ref 19–32)
Calcium: 9.8 mg/dL (ref 8.4–10.5)
Chloride: 103 mEq/L (ref 96–112)
Glucose, Bld: 103 mg/dL — ABNORMAL HIGH (ref 70–99)
Sodium: 139 mEq/L (ref 135–145)

## 2011-03-11 LAB — CBC
HCT: 44.9 % (ref 39.0–52.0)
MCV: 88.6 fL (ref 78.0–100.0)
Platelets: 207 10*3/uL (ref 150–400)
RBC: 5.07 MIL/uL (ref 4.22–5.81)
WBC: 9.1 10*3/uL (ref 4.0–10.5)

## 2011-03-11 MED ORDER — TRAMADOL HCL 50 MG PO TABS
50.0000 mg | ORAL_TABLET | Freq: Four times a day (QID) | ORAL | Status: DC | PRN
  Administered 2011-03-11: 50 mg via ORAL
  Filled 2011-03-11: qty 1

## 2011-03-11 NOTE — ED Notes (Signed)
Pt states he was talking to [facility] by phone today and told them he does not want to be here any more. Pt states he is overwhelmed with all that is going on. Pt states he is being charged and going to jail.

## 2011-03-11 NOTE — ED Notes (Signed)
Pt denies need at present time.  No distress noted.  Pt does report chronic pain in lower back, but not requesting medication at this time.  Comfort measures provided.  Sitter precautions in place.

## 2011-03-12 ENCOUNTER — Inpatient Hospital Stay (HOSPITAL_COMMUNITY)
Admission: RE | Admit: 2011-03-12 | Discharge: 2011-03-16 | DRG: 885 | Disposition: A | Discharge status: Home / Self Care | Payer: Medicare Other | Source: Ambulatory Visit | Attending: Psychiatry | Admitting: Psychiatry

## 2011-03-12 DIAGNOSIS — G8929 Other chronic pain: Secondary | ICD-10-CM

## 2011-03-12 DIAGNOSIS — Z981 Arthrodesis status: Secondary | ICD-10-CM

## 2011-03-12 DIAGNOSIS — F332 Major depressive disorder, recurrent severe without psychotic features: Principal | ICD-10-CM

## 2011-03-12 DIAGNOSIS — I10 Essential (primary) hypertension: Secondary | ICD-10-CM

## 2011-03-12 DIAGNOSIS — F1920 Other psychoactive substance dependence, uncomplicated: Secondary | ICD-10-CM

## 2011-03-12 DIAGNOSIS — F17200 Nicotine dependence, unspecified, uncomplicated: Secondary | ICD-10-CM

## 2011-03-12 DIAGNOSIS — R45851 Suicidal ideations: Secondary | ICD-10-CM

## 2011-03-12 DIAGNOSIS — Z86711 Personal history of pulmonary embolism: Secondary | ICD-10-CM

## 2011-03-12 DIAGNOSIS — E785 Hyperlipidemia, unspecified: Secondary | ICD-10-CM

## 2011-03-12 DIAGNOSIS — Z79899 Other long term (current) drug therapy: Secondary | ICD-10-CM

## 2011-03-12 DIAGNOSIS — M549 Dorsalgia, unspecified: Secondary | ICD-10-CM

## 2011-03-12 DIAGNOSIS — IMO0002 Reserved for concepts with insufficient information to code with codable children: Secondary | ICD-10-CM

## 2011-03-12 DIAGNOSIS — Z7901 Long term (current) use of anticoagulants: Secondary | ICD-10-CM

## 2011-03-12 NOTE — ED Notes (Signed)
Received call from [facility] at [facility], requesting recent VS.  Vitals taken and return call placed to [facility].

## 2011-03-12 NOTE — ED Notes (Signed)
Patient received at change of shift.   Patient with some depression and some passive suicidal ideation. No complaints of during the shift.  Has been accepted at behavioral health by Dr. Dan Humphreys.  Vida Roller, MD 03/12/11 0400

## 2011-03-13 LAB — PROTIME-INR
INR: 0.95 (ref 0.00–1.49)
Prothrombin Time: 12.9 seconds (ref 11.6–15.2)

## 2011-03-14 LAB — CBC
Hemoglobin: 15.8 g/dL (ref 13.0–17.0)
MCHC: 33.5 g/dL (ref 30.0–36.0)
RBC: 5.62 MIL/uL (ref 4.22–5.81)
RDW: 14.7 % (ref 11.5–15.5)

## 2011-03-14 LAB — BASIC METABOLIC PANEL
Calcium: 9.4 mg/dL (ref 8.4–10.5)
Creatinine, Ser: 1.02 mg/dL (ref 0.4–1.5)
GFR calc Af Amer: >60 mL/min (ref >60)
GFR calc non Af Amer: >60 mL/min (ref >60)
Sodium: 138 mEq/L (ref 135–145)

## 2011-03-14 LAB — TYPE AND SCREEN
ABO/RH(D): A POS
Antibody Screen: NEGATIVE

## 2011-03-14 LAB — PROTIME-INR: Prothrombin Time: 16.9 seconds — ABNORMAL HIGH (ref 11.6–15.2)

## 2011-03-14 NOTE — ED Provider Notes (Signed)
History     CSN: [id number] Arrival date & time: 03/11/2011  4:42 PM  Chief Complaint  Patient presents with  . Medical Clearance    (Consider location/radiation/quality/duration/timing/severity/associated sxs/prior treatment) History provided by: the pt states he wanted off narcotics and he was now suicidal.  he does not have a plan.  he wants in pt tx. History Limited By: the pt states he has had these suicidal thougts  for a few days and they are becoming worse.    Past Medical History  Diagnosis Date  . Substance abuse   . Back pain   . Broken arm   . Broken ribs     Past Surgical History  Procedure Date  . Back surgery   . Arm surgery     right arm    History reviewed. No pertinent family history.  History  Substance Use Topics  . Smoking status: Current Everyday Smoker -- 1.0 packs/day  . Smokeless tobacco: Not on file  . Alcohol Use: No      Review of Systems  Constitutional: Negative for fatigue.  HENT: Negative for congestion, sinus pressure and ear discharge.   Eyes: Negative for discharge.  Respiratory: Negative for cough.   Cardiovascular: Negative for chest pain.  Gastrointestinal: Negative for abdominal pain and diarrhea.  Genitourinary: Negative for frequency and hematuria.  Musculoskeletal: Negative for back pain.  Skin: Negative for rash.  Neurological: Negative for seizures and headaches.  Hematological: Negative.   Psychiatric/Behavioral: Positive for suicidal ideas and agitation. Negative for hallucinations.    Allergies  Review of patient's allergies indicates no known allergies.  Home Medications  No current outpatient prescriptions on file.  BP 118/81  Pulse 81  Temp(Src) 98 F (36.7 C) (Oral)  Resp 20  Ht 6\' 3"  (1.905 m)  Wt 225 lb (102.059 kg)  BMI 28.12 kg/m2  SpO2 97%  Physical Exam  Constitutional: He is oriented to person, place, and time. He appears well-developed.  HENT:  Head: Normocephalic and atraumatic.    Eyes: Conjunctivae and EOM are normal. No scleral icterus.  Neck: Neck supple. No thyromegaly present.  Cardiovascular: Normal rate and regular rhythm.  Exam reveals no gallop and no friction rub.   No murmur heard. Pulmonary/Chest: No stridor. He has no wheezes. He has no rales. He exhibits no tenderness.  Abdominal: He exhibits no distension. There is no tenderness. There is no rebound.  Musculoskeletal: Normal range of motion. He exhibits no edema.  Lymphadenopathy:    He has no cervical adenopathy.  Neurological: He is oriented to person, place, and time. Coordination normal.  Skin: No rash noted. No erythema.  Psychiatric:       Anxious and suicidal    ED Course  Procedures (including critical care time)  Labs Reviewed  BASIC METABOLIC PANEL - Abnormal; Notable for the following:    Glucose, Bld 103 (*)    All other components within normal limits  URINE RAPID DRUG SCREEN (HOSP PERFORMED) - Abnormal; Notable for the following:    Cocaine POSITIVE (*)    Benzodiazepines POSITIVE (*)    Tetrahydrocannabinol POSITIVE (*)    All other components within normal limits  CBC  DIFFERENTIAL  ETHANOL  LAB REPORT - SCANNED   No results found.   1. Depression     Results for orders placed during the hospital encounter of 03/11/11  CBC      Component Value Range   WBC 9.1  4.0 - 10.5 (K/uL)   RBC 5.07  4.22 - 5.81 (MIL/uL)   Hemoglobin 14.5  13.0 - 17.0 (g/dL)   HCT 14.7  82.9 - 56.2 (%)   MCV 88.6  78.0 - 100.0 (fL)   MCH 28.6  26.0 - 34.0 (pg)   MCHC 32.3  30.0 - 36.0 (g/dL)   RDW 13.0  86.5 - 78.4 (%)   Platelets 207  150 - 400 (K/uL)  DIFFERENTIAL      Component Value Range   Neutrophils Relative 65  43 - 77 (%)   Neutro Abs 5.9  1.7 - 7.7 (K/uL)   Lymphocytes Relative 27  12 - 46 (%)   Lymphs Abs 2.4  0.7 - 4.0 (K/uL)   Monocytes Relative 6  3 - 12 (%)   Monocytes Absolute 0.5  0.1 - 1.0 (K/uL)   Eosinophils Relative 2  0 - 5 (%)   Eosinophils Absolute 0.2   0.0 - 0.7 (K/uL)   Basophils Relative 0  0 - 1 (%)   Basophils Absolute 0.0  0.0 - 0.1 (K/uL)  BASIC METABOLIC PANEL      Component Value Range   Sodium 139  135 - 145 (mEq/L)   Potassium 4.2  3.5 - 5.1 (mEq/L)   Chloride 103  96 - 112 (mEq/L)   CO2 29  19 - 32 (mEq/L)   Glucose, Bld 103 (*) 70 - 99 (mg/dL)   BUN 6  6 - 23 (mg/dL)   Creatinine, Ser 6.96  0.50 - 1.35 (mg/dL)   Calcium 9.8  8.4 - 29.5 (mg/dL)   GFR calc non Af Amer >90  >90 (mL/min)   GFR calc Af Amer >90  >90 (mL/min)  ETHANOL      Component Value Range   Alcohol, Ethyl (B) <11  0 - 11 (mg/dL)  URINE RAPID DRUG SCREEN (HOSP PERFORMED)      Component Value Range   Opiates NONE DETECTED  NONE DETECTED    Cocaine POSITIVE (*) NONE DETECTED    Benzodiazepines POSITIVE (*) NONE DETECTED    Amphetamines NONE DETECTED  NONE DETECTED    Tetrahydrocannabinol POSITIVE (*) NONE DETECTED    Barbiturates NONE DETECTED  NONE DETECTED    No results found.    MDM   substance abuse and suicidal        Benny Lennert, MD 03/14/11 1217

## 2011-03-14 NOTE — Discharge Summary (Signed)
  NAMEALDYN, TOON NO.:  [id number]  MEDICAL RECORD NO.:  [id number]  LOCATION:  5012                         FACILITY:  [facility]  PHYSICIAN:  Almedia Balls. Ranell Patrick, M.D. DATE OF BIRTH:  1965/10/26  DATE OF ADMISSION:  02/07/2011 DATE OF DISCHARGE:  02/08/2011                              DISCHARGE SUMMARY   ADMISSION DIAGNOSIS:  Right displaced proximal humerus fracture.  DISCHARGE DIAGNOSIS:  Right displaced proximal humerus fracture status post open reduction and internal fixation.  BRIEF HISTORY:  The patient is a 45 year old male with a right proximal humerus fracture status post a fall.  The patient was elected for surgical management under the condition that he would try and curb his smoking habits due to decreased healing with smoking.  PROCEDURE:  The patient had a right shoulder ORIF by Dr. Malon Kindle on February 07, 2011.  Assistant was [employer], PA-C.  General anesthesia was used.  No complications.  HOSPITAL COURSE:  The patient admitted on February 07, 2011, for the above- stated procedure which he tolerated well.  After adequate time in Post Anesthesia Care, he was transferred to 5000.  Postop day #1, the patient complained of mild to moderate pain in the right shoulder but overall was doing okay.  He was able to tolerate some gentle physical therapy and occupational therapy.  The patient continued to work with physical therapy on postop day #2 and was doing quite well.  Thus, the patient will be discharged home.  His labs were within acceptable limits and overall he had an uneventful hospital stay.  DISCHARGE PLAN:  The patient will be discharged home on February 09, 2011.  His condition is stable.  His diet is regular.  DISCHARGE MEDICATIONS:  Robaxin 500 mg p.o. q. 6 hours and Percocet 5/325 1-2 tablets q.4-6 h. p.r.n. pain.  FOLLOWUP:  The patient will follow back up with Dr. Malon Kindle in 2 weeks.     Thomas B. Dixon,  P.A.   ______________________________ Almedia Balls. Ranell Patrick, M.D.   TBD/MEDQ  D:  02/11/2011  T:  02/11/2011  Job:  [id number]  Electronically Signed by Standley Dakins P.A. on 02/18/2011 03:41:38 PM Electronically Signed by Malon Kindle  on 03/14/2011 03:10:11 AM

## 2011-03-15 LAB — PROTIME-INR: INR: 1.36 (ref 0.00–1.49)

## 2011-03-18 NOTE — Discharge Summary (Signed)
NAMEHERMANN, DOTTAVIO NO.:  [id number]  MEDICAL RECORD NO.:  [id number]  LOCATION:  0302                          [facility]ACILITY:  [facility]  PHYSICIAN:  Orson Aloe, MD       DATE OF BIRTH:  11-07-1965  DATE OF ADMISSION:  03/12/2011 DATE OF DISCHARGE:  03/16/2011                              DISCHARGE SUMMARY   IDENTIFYING INFORMATION:  This is a 45 year old male.  This is a voluntary admission.  HISTORY OF PRESENT ILLNESS:  This is the third [facility] admission for [name] who has a history of polysubstance abuse and depression.  He was able to remain abstinent from substances about 2 weeks after his last discharge from our unit on January 11, 2011.  He relapsed on cocaine and has been using about 2 g a week, snorting powder.  Has also been using 5-6 tablets of unknown milligrams of alprazolam every week, getting it off the street.  He has been crushing and snorting or shooting in his veins oxycodone 30 mg immediate release 7-8 tablets a week.  He denies the use of alcohol.  He presented with suicidal thoughts and no specific plan, requesting help reachieving sobriety, getting safely off substances, and would like to go to a long-term treatment program.  MEDICAL EVALUATION:  Normally developed Caucasian male, overweight, who presents with a history of a pulmonary emboli in April 2011 and after a spinal fusion in 2009.  Previously on Coumadin.  He has not been taking it in 2-3 weeks.  Also history of dyslipidemia and hypertension.  Full physical exam was done in the emergency room along with the review of systems which is noted in the emergency room transcript.  He weighs 101 kg, 6 feet 2 inches tall.  Vital signs:  Temp 97.9 pulse 84, respirations 18, blood pressure 117/82.  CBC normal, hemoglobin of 14.5. Normal chemistries, BUN 6, creatinine 0.99.  Urine drug screen positive for benzodiazepines, cocaine and cannabis metabolites.  His alcohol screen was negative.  COURSE  OF HOSPITALIZATION:  He was admitted to our dual diagnosis unit and evaluated by our pharmacist who obtained a PT/PTT/INR and restarted him on his anticoagulant.  We also restarted Paxil 20 mg daily to address his depressive symptoms.  He had taken this in the past and felt that it was effective and he had tolerated it well.  Group participation and interaction with staff and peers was productive while here.  He had been living with his mother and was able to return there as needed.  He was started on a clonidine protocol with a goal of a safe detox from opiates and while here he displayed no acute withdrawal symptoms, did well on a clonidine protocol.  He had no complaints of shoulder pain while on our unit.  He worked with our Sports coach for long-term rehab and agreed to an application to the [facility] program in [place], [place].  By October 5 he was denying any suicidal or homicidal thoughts and continued to do well with group therapy.  By Sunday the 7th he was ready for discharge.  DISCHARGE PLAN:  He will follow up at the [facility] program on March 17, 2011 in the morning.  The family will transport him there.  DISCHARGE DIAGNOSES:  Axis I:  Major depressive disorder, recurrent, severe.  Polysubstance dependence. Axis II:  No diagnosis. Axis III:  History of pulmonary emboli on Coumadin, hypertension, controlled and hyperlipidemia.  Also history of right shoulder surgery healing well.  DISCHARGE MEDICATIONS: 1. Amlodipine 10 mg at bedtime. 2. Paxil 20 mg at bedtime. 3. Simvastatin 20 mg at bedtime. 4. Warfarin 5 mg 2 tablets for 2 days, then 3 tablets for the 3rd day     and he is to have a PT/PTT and INR drawn at the [facility] program on     the following day. 5. He is instructed to discontinue tramadol, lisinopril, Robaxin and     naproxen sodium.     [name], N.P.   ______________________________ Orson Aloe, MD    MAS/MEDQ  D:  03/17/2011  T:   03/17/2011  Job:  [id number]  Electronically Signed by Kari Baars N.P. on 03/17/2011 02:52:18 PM Electronically Signed by Orson Aloe  on 03/18/2011 11:33:33 AM

## 2011-03-18 NOTE — Assessment & Plan Note (Signed)
NAMEJERMANE, [name] NO.:  [id number]  MEDICAL RECORD NO.:  [id number]  LOCATION:  0302                          FACILITY:  [facility]  PHYSICIAN:  Orson Aloe, MD       DATE OF BIRTH:  [birth year]  DATE OF ADMISSION:  03/12/2011 DATE OF DISCHARGE:                      PSYCHIATRIC ADMISSION ASSESSMENT   DATE OF ASSESSMENT:  March 12, 2011, at 12:50 p.m.  IDENTIFYING INFORMATION:  This is a 45 year old male.  This is a voluntary admission.  HISTORY OF THE PRESENT ILLNESS:  [name] presents complaining of relapsing on several substances.  He also reports he has been very depressed with suicidal thoughts but no specific plan.  Feels he cannot go on living his life like this.  He has been off of his Paxil since his prescription ran out about 3 weeks ago.  He contacted [facility] who declined to get him into a treatment program because he had not been medically cleared.  Two weeks ago, he relapsed on cocaine and has been using about 2 grams a week, snorting powder.  He has been using alprazolam 5-6 tablets of unknown milligrams this past week, getting it off the street.  He has not been drinking alcohol.  He has been using oxycodone 30 mg immediate-release about 7-8 tablets a week, crushing and snorting them or crushing and shooting them.  He has a past history of using heroin as recently as March of 2012 but has not used since that time.  Also, his primary medical doctor has released him from his practice because of his polysubstance abuse, and he is unable to get Coumadin which he requires for anticoagulation after suffering 2 pulmonary emboli.  PAST PSYCHIATRIC HISTORY:  This is a 45 year old male, most recently on our unit August the 1st to August the 4th 2012.  This is his 3rd [facility] admission.  He has a history of polysubstance abuse and depression and has taken Paxil for this in the past.  After his last discharge from [facility], he  remained abstinent for about 2 weeks and went to [facility] outpatient program and was kicked out of the program after he relapsed 2 weeks later.  Currently, he is receiving no outpatient care.  SOCIAL HISTORY:  High school graduate.  Until 2009, he worked in the [employer] on a regular basis.  He had to stop working due to problems with degenerative disk disease.  He has significant medical problems of pulmonary emboli after back surgery in 2009.  He currently receives disability for those medical problems.  He received a financial settlement in March of 2012 and relapsed with heavy use of drugs, including heroin, and was caught with possession of drugs and charged with trafficking.  He is scheduled October the 17th for court in [facility] and expects that he may go to jail after that.  He denies a family history of mental illness or substance abuse.  He has been living with his mother in [place] and can return there.  MEDICAL HISTORY: 1. No regular primary care provider. 2. Current medical problems are hypertension; history of pulmonary     emboli, most recently with a second pulmonary embolus in  April of     2011 and currently on Coumadin.  Also dyslipidemia and     hypertension. 3. Past medical history:  Spinal fusion in 2009 by Dr. Hilda Lias     and a proximal humerus fracture open reduction and internal     fixation by Dr. Ranell Patrick on February 07, 2011.  No history of seizure.     No known history of hematology workup.  CURRENT MEDICATIONS:  He received tramadol for pain in the emergency room.  Previously, he was on simvastatin, lisinopril, amlodipine Coumadin and Valium for muscle spasms and Paxil 20 mg daily.  No medications in at least 2-3 weeks.  DRUG ALLERGIES:  None.  PHYSICAL EXAM:  Done in the emergency room and is noted in the record. This is a fully alert, large-built male who appears in no physical distress today.  Normally developed, no  abnormal movements.  He weighs 101 kg.  He is 6 feet 2 inches tall.  Temperature 97.9, pulse 84, respirations 18, blood pressure 117/82.  CBC normal with a hemoglobin of 14.5.  Chemistries are normal.  BUN 6, creatinine 0.99.  Urine drug screen positive for benzodiazepines, cocaine and cannabis metabolites. Alcohol screen negative.  MENTAL STATUS EXAM:  Fully alert male.  Blunt affect.  Slow movements. Appears depressed.  Complains of depressed mood and does appear depressed.  Some very slight slowness to his speech, soft in tone, barely audible at times but gives a coherent history.  He is cooperative, asks for help with his depression.  Said that the Paxil had worked pretty well for him before.  He endorses passive suicidal thoughts today with no plan.  Feels he can be safe here on the unit. Cognitively, he is completely intact.  Axis I:  Major depressive disorder and polysubstance dependence. Axis II:  Deferred. Axis III:  History of pulmonary embolism on Coumadin, dyslipidemia, hypertension. Axis IV:  Significant medical and legal issues. Axis V:  Current 40, past year 65 estimated.  PLAN:  Voluntarily admit him to our unit.  We are going to restart his routine medication of a lisinopril 5 mg daily.  We will restart his Paxil at 20 mg daily to address his depressive symptoms.  Our pharmacist has seen him, and we will get a PT, PTT and INR and restart his Coumadin given his history of 2 previous pulmonary emboli.  We will work with our case manager to see if we get him some ongoing substance abuse treatment in the community.     Margaret A. Lorin Picket, N.P.   ______________________________ Orson Aloe, MD    MAS/MEDQ  D:  03/12/2011  T:  03/12/2011  Job:  [id number]  Electronically Signed by Kari Baars N.P. on 03/13/2011 08:21:48 AM Electronically Signed by Orson Aloe  on 03/18/2011 11:33:23 AM
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
## 2012-01-31 IMAGING — CR DG SHOULDER 2+V PORT*R*
2 series · 2 of 2 positions shown · non-contrast
Comparison: 12/25/2010

CLINICAL DATA: Left shoulder fracture.  Status post open reduction
and internal fixation.

PORTABLE RIGHT SHOULDER - 2+ VIEW

[AP (1 of 2)]
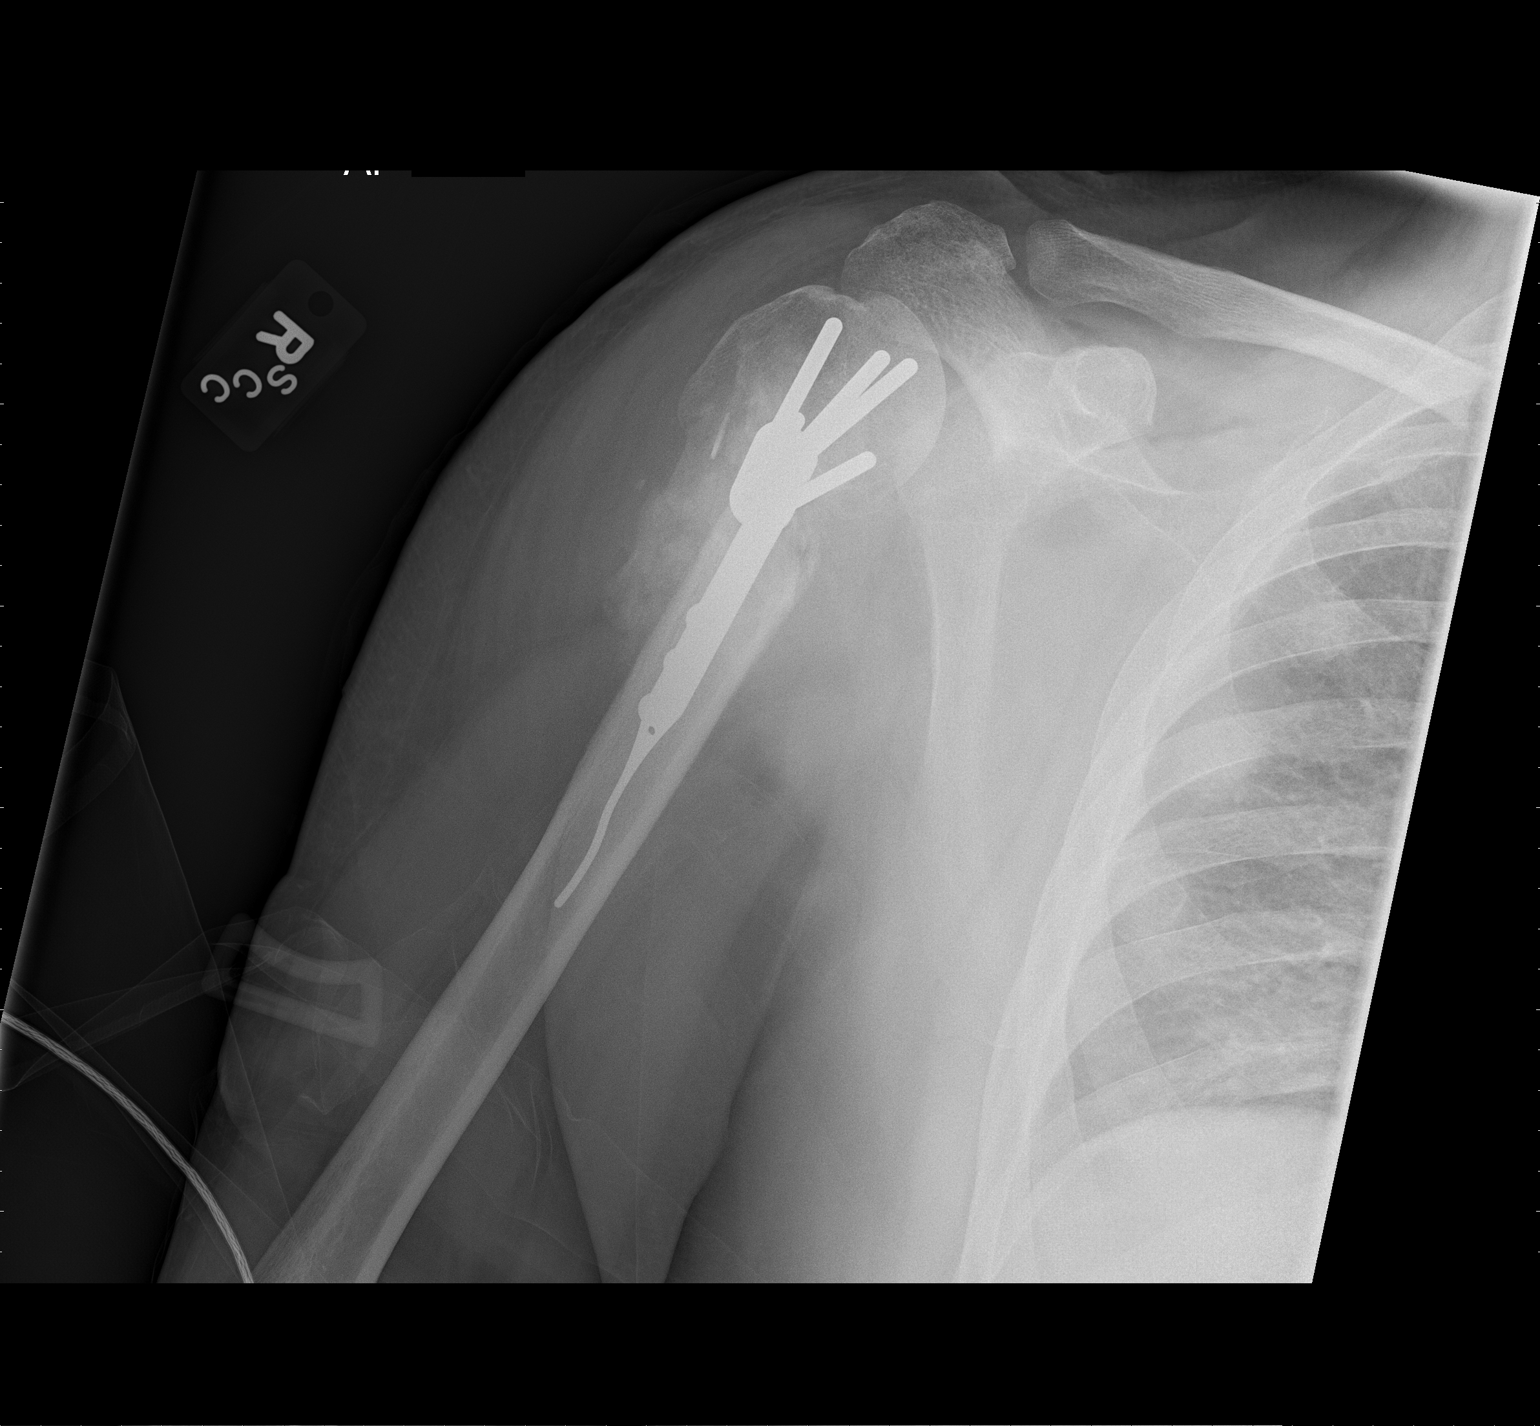

[AP (2 of 2)]
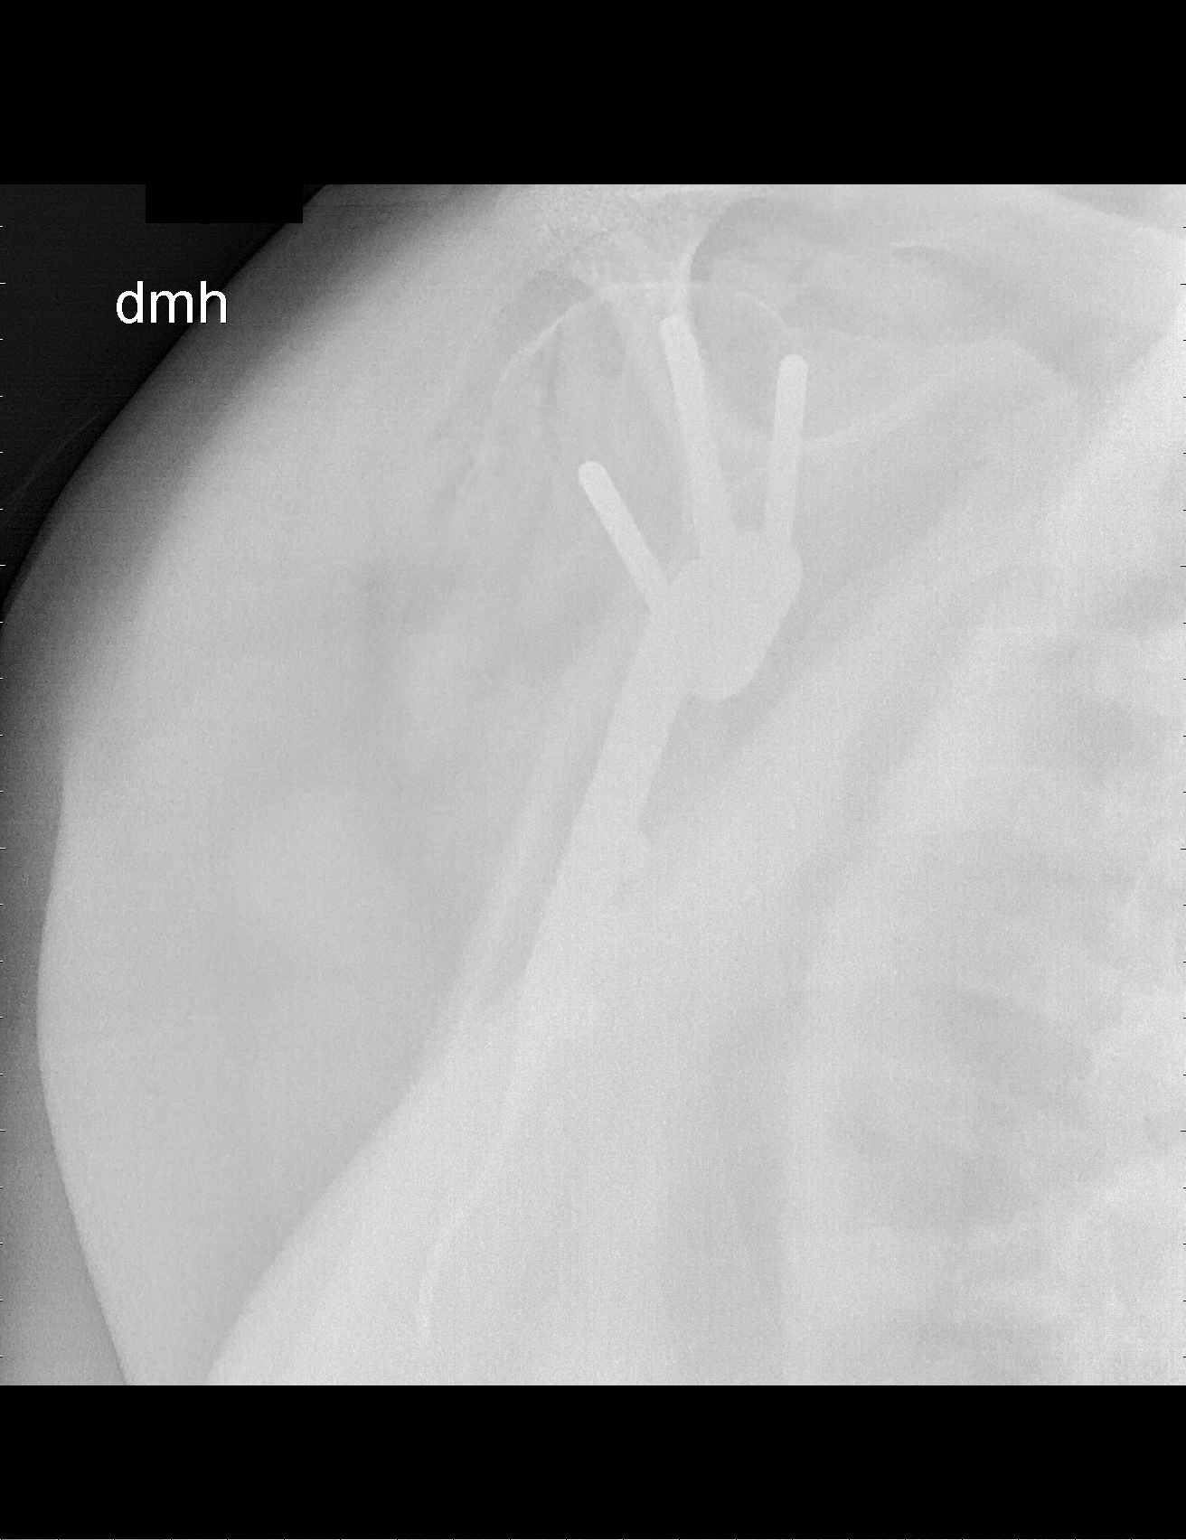

[2 of 2 positions shown; findings below may reference images not displayed]

FINDINGS: Screw fixation device is identified reducing a comminuted
fracture deformity involving the proximal right humerus.

The fracture fragments appear to be in near anatomic alignment.
There is callus formation surrounding the fracture site and the
fracture lines appear indistinct.
IMPRESSION: 1.  Prior open reduction and internal fixation of the proximal
right comminuted fracture.  Compared with 12/25/2010 there is
evidence of interval healing.

## 2012-03-04 ENCOUNTER — Encounter (HOSPITAL_COMMUNITY): Payer: Self-pay | Admitting: Emergency Medicine

## 2012-03-04 ENCOUNTER — Emergency Department (HOSPITAL_COMMUNITY)
Admission: EM | Admit: 2012-03-04 | Discharge: 2012-03-04 | Disposition: A | Discharge status: Home / Self Care | Payer: Medicare Other | Attending: Emergency Medicine | Admitting: Emergency Medicine

## 2012-03-04 ENCOUNTER — Emergency Department (HOSPITAL_COMMUNITY): Payer: Medicare Other

## 2012-03-04 DIAGNOSIS — M5416 Radiculopathy, lumbar region: Secondary | ICD-10-CM

## 2012-03-04 DIAGNOSIS — M545 Low back pain, unspecified: Secondary | ICD-10-CM | POA: Insufficient documentation

## 2012-03-04 DIAGNOSIS — IMO0002 Reserved for concepts with insufficient information to code with codable children: Secondary | ICD-10-CM | POA: Insufficient documentation

## 2012-03-04 DIAGNOSIS — Y92009 Unspecified place in unspecified non-institutional (private) residence as the place of occurrence of the external cause: Secondary | ICD-10-CM | POA: Insufficient documentation

## 2012-03-04 DIAGNOSIS — F17200 Nicotine dependence, unspecified, uncomplicated: Secondary | ICD-10-CM | POA: Insufficient documentation

## 2012-03-04 DIAGNOSIS — X500XXA Overexertion from strenuous movement or load, initial encounter: Secondary | ICD-10-CM | POA: Insufficient documentation

## 2012-03-04 MED ORDER — PREDNISONE 50 MG PO TABS: 50.0000 mg | ORAL_TABLET | Freq: Every day | ORAL | Status: DC

## 2012-03-04 MED ORDER — HYDROCODONE-ACETAMINOPHEN 5-325 MG PO TABS: 1.0000 | ORAL_TABLET | Freq: Four times a day (QID) | ORAL | Status: AC | PRN

## 2012-03-04 NOTE — ED Notes (Signed)
Pt c/o lower back pain that radiates down left leg since this am.

## 2012-03-04 NOTE — ED Provider Notes (Signed)
History     CSN: [id number]  Arrival date & time 03/04/12  1138   First MD Initiated Contact with Patient 03/04/12 1227      Chief Complaint  Patient presents with  . Back Pain    (Consider location/radiation/quality/duration/timing/severity/associated sxs/prior treatment) HPI Comments: Twisted lower back when he sat up in bed this AM.  Now having back pain and L radicular pain.  3 LS surgeries by dr. Jeral Fruit.  The history is provided by the patient. No language interpreter was used.    Past Medical History  Diagnosis Date  . Substance abuse   . Back pain   . Broken arm   . Broken ribs     Past Surgical History  Procedure Date  . Back surgery   . Arm surgery     right arm    No family history on file.  History  Substance Use Topics  . Smoking status: Current Every Day Smoker -- 1.0 packs/day    Types: Cigarettes  . Smokeless tobacco: Not on file  . Alcohol Use: No      Review of Systems  Constitutional: Negative for fever and chills.  Musculoskeletal: Positive for back pain.  Neurological: Negative for weakness and numbness.  All other systems reviewed and are negative.    Allergies  Review of patient's allergies indicates no known allergies.  Home Medications   Current Outpatient Rx  Name Route Sig Dispense Refill  . NAPROXEN SODIUM 220 MG PO TABS Oral Take 220 mg by mouth 2 (two) times daily with a meal. Pain     . HYDROCODONE-ACETAMINOPHEN 5-325 MG PO TABS Oral Take 1 tablet by mouth every 6 (six) hours as needed for pain. 20 tablet 0  . PREDNISONE 50 MG PO TABS Oral Take 1 tablet (50 mg total) by mouth daily. 6 tablet 0    BP 142/97  Pulse 97  Temp 98.1 F (36.7 C) (Oral)  Ht 6\' 3"  (1.905 m)  Wt 235 lb (106.595 kg)  BMI 29.37 kg/m2  SpO2 100%  Physical Exam  Nursing note and vitals reviewed. Constitutional: He is oriented to person, place, and time. He appears well-developed and well-nourished.  HENT:  Head: Normocephalic and  atraumatic.  Eyes: EOM are normal.  Neck: Normal range of motion.  Cardiovascular: Normal rate, regular rhythm, normal heart sounds and intact distal pulses.   Pulmonary/Chest: Effort normal and breath sounds normal. No respiratory distress.  Abdominal: Soft. He exhibits no distension. There is no tenderness.  Musculoskeletal:       Lumbar back: He exhibits decreased range of motion, tenderness and pain. He exhibits no bony tenderness, no deformity, no spasm and normal pulse.       Back:       Pain radiates to L foot  Neurological: He is alert and oriented to person, place, and time.  Skin: Skin is warm and dry.  Psychiatric: He has a normal mood and affect. Judgment normal.    ED Course  Procedures (including critical care time)  Labs Reviewed - No data to display No results found.   1. Lumbar back pain   2. Lumbar radiculopathy, acute      MDM  No saddle dysthesia or incontinence.        Evalina Field, [place] 03/10/12 1623

## 2012-03-04 NOTE — ED Notes (Signed)
Patient has chronic back pain. Has had 3 lumbar surgeries in 2009

## 2012-03-04 NOTE — ED Notes (Signed)
Patient with no complaints at this time. Respirations even and unlabored. Skin warm/dry. Discharge instructions reviewed with patient at this time. Patient given opportunity to voice concerns/ask questions. IV removed per policy and band-aid applied to site. Patient discharged at this time and left Emergency Department with steady gait.  

## 2012-03-12 NOTE — ED Provider Notes (Signed)
Medical screening examination/treatment/procedure(s) were performed by non-physician practitioner and as supervising physician I was immediately available for consultation/collaboration.   Shepard Keltz III, MD 03/12/12 1107 

## 2012-12-21 ENCOUNTER — Encounter (HOSPITAL_COMMUNITY): Payer: Self-pay

## 2012-12-21 ENCOUNTER — Emergency Department (HOSPITAL_COMMUNITY)
Admission: EM | Admit: 2012-12-21 | Discharge: 2012-12-21 | Disposition: A | Discharge status: Home / Self Care | Payer: Medicare Other | Attending: Emergency Medicine | Admitting: Emergency Medicine

## 2012-12-21 DIAGNOSIS — M545 Low back pain, unspecified: Secondary | ICD-10-CM | POA: Insufficient documentation

## 2012-12-21 DIAGNOSIS — Z9889 Other specified postprocedural states: Secondary | ICD-10-CM | POA: Insufficient documentation

## 2012-12-21 DIAGNOSIS — M549 Dorsalgia, unspecified: Secondary | ICD-10-CM

## 2012-12-21 DIAGNOSIS — G8929 Other chronic pain: Secondary | ICD-10-CM | POA: Insufficient documentation

## 2012-12-21 DIAGNOSIS — F17200 Nicotine dependence, unspecified, uncomplicated: Secondary | ICD-10-CM | POA: Insufficient documentation

## 2012-12-21 DIAGNOSIS — Z8781 Personal history of (healed) traumatic fracture: Secondary | ICD-10-CM | POA: Insufficient documentation

## 2012-12-21 LAB — URINALYSIS, ROUTINE W REFLEX MICROSCOPIC
Glucose, UA: NEGATIVE mg/dL
Hgb urine dipstick: NEGATIVE
Leukocytes, UA: NEGATIVE
Protein, ur: NEGATIVE mg/dL
pH: 5.5 (ref 5.0–8.0)

## 2012-12-21 MED ORDER — CYCLOBENZAPRINE HCL 10 MG PO TABS
10.0000 mg | ORAL_TABLET | Freq: Once | ORAL | Status: AC
  Administered 2012-12-21: 10 mg via ORAL
  Filled 2012-12-21: qty 1

## 2012-12-21 MED ORDER — OXYCODONE-ACETAMINOPHEN 5-325 MG PO TABS
1.0000 | ORAL_TABLET | Freq: Once | ORAL | Status: AC
  Administered 2012-12-21: 1 via ORAL
  Filled 2012-12-21: qty 1

## 2012-12-21 MED ORDER — PREDNISONE 10 MG PO TABS: ORAL_TABLET | ORAL | Status: DC

## 2012-12-21 MED ORDER — CYCLOBENZAPRINE HCL 10 MG PO TABS: 10.0000 mg | ORAL_TABLET | Freq: Three times a day (TID) | ORAL | Status: DC | PRN

## 2012-12-21 NOTE — ED Notes (Signed)
Complain of low back. Pt states he has a history of same. Has been incarcerated and unable to see  PCP

## 2012-12-21 NOTE — ED Notes (Addendum)
Pt presents with lower back pain that began last night, worsening this morning. Pt denies loss of bowel/bladder function.  Denies radiation of pain. Pt reports prior back pain. Denies injury or trauma to back.

## 2012-12-21 NOTE — ED Provider Notes (Signed)
History    CSN: [id number] Arrival date & time 12/21/12  1303  First MD Initiated Contact with Patient 12/21/12 1327     Chief Complaint  Patient presents with  . Back Pain   (Consider location/radiation/quality/duration/timing/severity/associated sxs/prior Treatment) HPI Comments: Patient complains of acute on chronic low back pain that began 3-4 days ago.  States his chronic back pain is associated with previous lumbar surgeries involving the left low back and now the pain is on the right side.  Describes the pain as burning, pulling, deep pain that is worse with certain movements and standing and radiates to the right hip.  He denies numbness or weakness of the right leg, swelling, incontinence of bladder or bowel, or dysuria.    Patient is a 47 y.o. male presenting with back pain. The history is provided by the patient.  Back Pain Location:  Lumbar spine Quality:  Aching Radiates to: right hip. Pain severity:  Moderate Pain is:  Same all the time Onset quality:  Gradual Duration:  3 days Timing:  Constant Progression:  Worsening Chronicity:  New Context: not falling, not lifting heavy objects and not recent injury   Relieved by:  Nothing Worsened by:  Ambulation, bending, twisting and movement Ineffective treatments:  None tried Associated symptoms: no abdominal pain, no abdominal swelling, no bladder incontinence, no bowel incontinence, no chest pain, no dysuria, no fever, no headaches, no leg pain, no numbness, no paresthesias, no pelvic pain, no perianal numbness, no tingling and no weakness    Past Medical History  Diagnosis Date  . Substance abuse   . Back pain   . Broken arm   . Broken ribs    Past Surgical History  Procedure Laterality Date  . Back surgery    . Arm surgery      right arm   No family history on file. History  Substance Use Topics  . Smoking status: Current Every Day Smoker -- 1.00 packs/day    Types: Cigarettes  . Smokeless tobacco: Not  on file  . Alcohol Use: No    Review of Systems  Constitutional: Negative for fever, activity change and appetite change.  Respiratory: Negative for shortness of breath.   Cardiovascular: Negative for chest pain.  Gastrointestinal: Negative for vomiting, abdominal pain, constipation, abdominal distention and bowel incontinence.  Genitourinary: Negative for bladder incontinence, dysuria, hematuria, flank pain, decreased urine volume, difficulty urinating, penile pain, testicular pain and pelvic pain.       No perineal numbness or incontinence of urine or feces  Musculoskeletal: Positive for back pain. Negative for joint swelling.  Skin: Negative for rash.  Neurological: Negative for tingling, weakness, numbness, headaches and paresthesias.  All other systems reviewed and are negative.    Allergies  Review of patient's allergies indicates no known allergies.  Home Medications  No current outpatient prescriptions on file. BP 124/98  Pulse 102  Temp(Src) 98.2 F (36.8 C) (Oral)  Resp 20  Ht 6' 3.75" (1.924 m)  Wt 240 lb (108.863 kg)  BMI 29.41 kg/m2  SpO2 100% Physical Exam  Nursing note and vitals reviewed. Constitutional: He is oriented to person, place, and time. He appears well-developed and well-nourished. No distress.  HENT:  Head: Normocephalic and atraumatic.  Neck: Normal range of motion. Neck supple.  Cardiovascular: Normal rate, regular rhythm, normal heart sounds and intact distal pulses.   No murmur heard. Pulmonary/Chest: Effort normal and breath sounds normal. No respiratory distress. He exhibits no tenderness.  Abdominal: Soft. He  exhibits no distension and no mass. There is no tenderness. There is no rebound and no guarding.  Musculoskeletal: He exhibits tenderness. He exhibits no edema.       Lumbar back: He exhibits tenderness and pain. He exhibits normal range of motion, no swelling, no deformity, no laceration and normal pulse.  ttp of the right lumbar  paraspinal muscles and SI joint space.  No spinal tenderness.  DP pulses are brisk and symmetrical.  Distal sensation intact.  Hip Flexors/Extensors are intact.  No edema or skin lesions  Neurological: He is alert and oriented to person, place, and time. No cranial nerve deficit or sensory deficit. He exhibits normal muscle tone. Coordination and gait normal.  Reflex Scores:      Patellar reflexes are 2+ on the right side and 2+ on the left side.      Achilles reflexes are 2+ on the right side and 2+ on the left side. Skin: Skin is warm and dry.    ED Course  Procedures (including critical care time) Results for orders placed during the hospital encounter of 12/21/12  URINALYSIS, ROUTINE W REFLEX MICROSCOPIC      Result Value Range   Color, Urine YELLOW  YELLOW   APPearance CLEAR  CLEAR   Specific Gravity, Urine >1.030 (*) 1.005 - 1.030   pH 5.5  5.0 - 8.0   Glucose, UA NEGATIVE  NEGATIVE mg/dL   Hgb urine dipstick NEGATIVE  NEGATIVE   Bilirubin Urine NEGATIVE  NEGATIVE   Ketones, ur NEGATIVE  NEGATIVE mg/dL   Protein, ur NEGATIVE  NEGATIVE mg/dL   Urobilinogen, UA 0.2  0.0 - 1.0 mg/dL   Nitrite NEGATIVE  NEGATIVE   Leukocytes, UA NEGATIVE  NEGATIVE      MDM    VSS.  Pt is well appearing.  He has ttp of the right lumbar paraspinal muscles and SI joint.  No focal neuro deficits on exam.  Ambulates with a steady gait.   Doubt emergent neurological or infectious process.  Pt advised that ED is not proper location for chronic pain management and that he will need to f/u with pain management or PCP.  Referral info given.  Will treat with prednisone taper and flexeril  Kierra Jezewski L. Nohelani Benning, PA-C 12/23/12 1235

## 2012-12-25 NOTE — ED Provider Notes (Signed)
Medical screening examination/treatment/procedure(s) were performed by non-physician practitioner and as supervising physician I was immediately available for consultation/collaboration.   Joya Gaskins, MD 12/25/12 [phone]

## 2014-07-26 ENCOUNTER — Emergency Department (HOSPITAL_COMMUNITY)
Admission: EM | Admit: 2014-07-26 | Discharge: 2014-07-26 | Disposition: A | Discharge status: Home / Self Care | Payer: Medicare Other | Attending: Emergency Medicine | Admitting: Emergency Medicine

## 2014-07-26 ENCOUNTER — Emergency Department (HOSPITAL_COMMUNITY): Payer: Medicare Other

## 2014-07-26 ENCOUNTER — Encounter (HOSPITAL_COMMUNITY): Payer: Self-pay | Admitting: Emergency Medicine

## 2014-07-26 DIAGNOSIS — Y9389 Activity, other specified: Secondary | ICD-10-CM | POA: Diagnosis not present

## 2014-07-26 DIAGNOSIS — Z7952 Long term (current) use of systemic steroids: Secondary | ICD-10-CM | POA: Diagnosis not present

## 2014-07-26 DIAGNOSIS — Z79899 Other long term (current) drug therapy: Secondary | ICD-10-CM | POA: Diagnosis not present

## 2014-07-26 DIAGNOSIS — W108XXA Fall (on) (from) other stairs and steps, initial encounter: Secondary | ICD-10-CM | POA: Insufficient documentation

## 2014-07-26 DIAGNOSIS — I10 Essential (primary) hypertension: Secondary | ICD-10-CM | POA: Diagnosis not present

## 2014-07-26 DIAGNOSIS — Z720 Tobacco use: Secondary | ICD-10-CM | POA: Insufficient documentation

## 2014-07-26 DIAGNOSIS — S8992XA Unspecified injury of left lower leg, initial encounter: Secondary | ICD-10-CM | POA: Insufficient documentation

## 2014-07-26 DIAGNOSIS — Z760 Encounter for issue of repeat prescription: Secondary | ICD-10-CM | POA: Insufficient documentation

## 2014-07-26 DIAGNOSIS — Y998 Other external cause status: Secondary | ICD-10-CM | POA: Insufficient documentation

## 2014-07-26 DIAGNOSIS — Y9289 Other specified places as the place of occurrence of the external cause: Secondary | ICD-10-CM | POA: Insufficient documentation

## 2014-07-26 DIAGNOSIS — Z791 Long term (current) use of non-steroidal anti-inflammatories (NSAID): Secondary | ICD-10-CM | POA: Insufficient documentation

## 2014-07-26 DIAGNOSIS — M25562 Pain in left knee: Secondary | ICD-10-CM | POA: Diagnosis not present

## 2014-07-26 DIAGNOSIS — Z8781 Personal history of (healed) traumatic fracture: Secondary | ICD-10-CM | POA: Diagnosis not present

## 2014-07-26 HISTORY — DX: Essential (primary) hypertension: I10

## 2014-07-26 LAB — I-STAT CHEM 8, ED
BUN: 9 mg/dL (ref 6–23)
CALCIUM ION: 1.14 mmol/L (ref 1.12–1.23)
CHLORIDE: 103 mmol/L (ref 96–112)
Creatinine, Ser: 1 mg/dL (ref 0.50–1.35)
GLUCOSE: 92 mg/dL (ref 70–99)
HCT: 52 % (ref 39.0–52.0)
Hemoglobin: 17.7 g/dL — ABNORMAL HIGH (ref 13.0–17.0)
Potassium: 3.9 mmol/L (ref 3.5–5.1)
Sodium: 140 mmol/L (ref 135–145)
TCO2: 22 mmol/L (ref 0–100)

## 2014-07-26 MED ORDER — AMLODIPINE BESYLATE 2.5 MG PO TABS: 2.5000 mg | ORAL_TABLET | Freq: Every day | ORAL | Status: AC

## 2014-07-26 MED ORDER — LISINOPRIL 10 MG PO TABS: 10.0000 mg | ORAL_TABLET | Freq: Every day | ORAL | Status: AC

## 2014-07-26 MED ORDER — HYDROCODONE-ACETAMINOPHEN 5-325 MG PO TABS: ORAL_TABLET | ORAL | Status: DC

## 2014-07-26 NOTE — ED Notes (Addendum)
Patient states he slipped coming down stairs yesterday and twisted his left knee. Complaining of pain in left knee since injury. Patient hypertensive in triage. States "I used to be on medication in jail but it ran out. I have an appointment on March 10 to get my medicine."

## 2014-07-26 NOTE — Discharge Instructions (Signed)
°Emergency Department Resource Guide °1) Find a Doctor and Pay Out of Pocket °Although you won't have to find out who is covered by your insurance plan, it is a good idea to ask around and get recommendations. You will then need to call the office and see if the doctor you have chosen will accept you as a new patient and what types of options they offer for patients who are self-pay. Some doctors offer discounts or will set up payment plans for their patients who do not have insurance, but you will need to ask so you aren't surprised when you get to your appointment. ° °2) Contact Your Local Health Department °Not all health departments have doctors that can see patients for sick visits, but many do, so it is worth a call to see if yours does. If you don't know where your local health department is, you can check in your phone book. The CDC also has a tool to help you locate your state's health department, and many state websites also have listings of all of their local health departments. ° °3) Find a Walk-in Clinic °If your illness is not likely to be very severe or complicated, you may want to try a walk in clinic. These are popping up all over the country in pharmacies, drugstores, and shopping centers. They're usually staffed by nurse practitioners or physician assistants that have been trained to treat common illnesses and complaints. They're usually fairly quick and inexpensive. However, if you have serious medical issues or chronic medical problems, these are probably not your best option. ° °No Primary Care Doctor: °- Call Health Connect at  832-8000 - they can help you locate a primary care doctor that  accepts your insurance, provides certain services, etc. °- Physician Referral Service- 1-800-533-3463 ° °Chronic Pain Problems: °Organization         Address  Phone   Notes  °[facility]  (336) 297-2271 Patients need to be referred by their primary care doctor.  ° °Medication  Assistance: °Organization         Address  Phone   Notes  °[place] [facility] [address] °[place], [place] [address] (336) 641-8030 --Must be a resident of [place] °-- Must have NO insurance coverage whatsoever (no Medicaid/ Medicare, etc.) °-- The pt. MUST have a primary care doctor that directs their care regularly and follows them in the community °  °[facility]  (866) 331-1348   °[employer]  (888) 892-1162   ° °Agencies that provide inexpensive medical care: °Organization         Address  Phone   Notes  °[facility]  (336) 832-8035   °[facility]    (336) 832-7272   °[facility] [address] °[place], [place] [address] (336) 832-4777   °[facility] [address], °[place] (336) 271-4999   °[facility]    (336) 373-0678   °[facility]    (336) 272-1050   °[facility] ° [address], [place] Phone:  (336) 832-4444, Fax:  (336) 832-4440 Hours of Operation:  9 am - 6 pm, M-F.  Also accepts Medicaid/Medicare and self-pay.  °[facility] ° [address], [place] Phone: (336) 832-3150, Fax: (336) [phone]. Hours of Operation:  8:30 am - 5:30 pm, M-F.  Also accepts Medicaid and self-pay.  °[facility] [place] [address], [place] Phone: (336) 878-6027   °[facility] [address], [place], [place] (336)723-1848, Ext. 123 Mondays & Thursdays: 7-9 AM.  First 15 patients are seen on a first come, first serve basis. °  ° °Medicaid-accepting [place] Providers: ° °Organization         Address  Phone   Notes  °[facility] [address], [place] (336) 641-2100 Also accepts self-pay patients.  °[facility] [address], [place] ° (336) 856-9996   °[facility] [address], [place]  (336) 288-8857   °[facility] [address] [place] [address], [place] (336) 299-7000   °Veita Bland [address], [place]  ° (336) 373-1557 Only accepts [place] Access Medicaid patients after they have their name applied to their card.  ° °Self-Pay (no insurance) in [place]: ° °Organization         Address  Phone   Notes  °Sickle Cell Patients, [facility] [address], [place] (336) 832-1970   °[facility] [address], [place] (336) 832-4400   °[facility] ° [address], [place] (336) 992-4800   °[facility]/Dr. Osei-Bonsu ° [address] [place] [address], [place] or [address], [place] (336) 841-8500 Phone number for both [place] and [place] locations is the same.  °[facility] [address], [place] (336) 299-0000   °[facility] [address] [place] [address], [place] or [address] (336) 852-7530 °(336) 878-2260   °[facility] [address], [place] (336) 350-1642, phone; (336) 294-5005, fax Sees patients 1st and 3rd Saturday of every month.  Must not qualify for public or private insurance (i.e. Medicaid, Medicare, [place] Health Choice, Veterans' Benefits) • Household income should be no more than 200% of the poverty level •The clinic cannot treat you if you are pregnant or think you are pregnant • Sexually transmitted diseases are not treated at the clinic.  ° ° °Dental Care: °Organization         Address  Phone  Notes  °[place] [facility] [address], [place] (336) 641-6152 Accepts children up to age 21 who are enrolled in Medicaid or [place] Health Choice; pregnant women with a Medicaid card; and children who have applied for Medicaid or [place] Health Choice, but were declined, whose parents can pay a reduced fee at time of service.  °[place]  [facility] [place]  [address], [place] (336) 641-7733 Accepts children up to age 21 who are enrolled in Medicaid or [place] Health Choice; pregnant women with a Medicaid card; and children who have applied for Medicaid or [place] Health Choice, but were declined, whose parents can pay a reduced fee at time of service.  °[facility] ° [address], [place] (336) 641-4533 Patients are seen by appointment only. Walk-ins are not accepted. [facility] will see patients 18 years of age and older. °Monday - Tuesday (8am-5pm) °Most Wednesdays (8:30-5pm) °$30 per visit, cash only  °[facility] ° [address], [place] (336) 641-4533 Patients are seen by appointment only. Walk-ins are not accepted. [facility] will see patients 18 years of age and older. °One   Wednesday Evening (Monthly: Volunteer Based).  $30 per visit, cash only  °[facility]  (919) 537-3737 for adults; Children under age 4, call Graduate Pediatric Dentistry at ([phone]) [phone]. Children aged 4-14, please call (919) [phone] to request a pediatric application. ° Dental services are provided in all areas of dental care including fillings, crowns and bridges, complete and partial dentures, implants, gum treatment, root canals, and extractions. Preventive care is also provided. Treatment is provided to both adults and children. °Patients are selected via a lottery and there is often a waiting list. °  °[facility] [address], °[place] ° (336) 763-8833 [email or web address] °  °[facility] [address], [place], [place] (336)723-1848, Ext. 123 Second and Fourth Thursday of each month, opens at 6:30 AM; Clinic ends at 9 AM.  Patients are seen on a first-come first-served basis, and a limited number are seen during each clinic.  ° °[facility] ° [address], [place], [place] (336) 723-7904    Eligibility Requirements °You must have lived in [place], [place], or [place] counties for at least the last three months. °  You cannot be eligible for state or federal sponsored healthcare insurance, including Veterans Administration, Medicaid, or Medicare. °  You generally cannot be eligible for healthcare insurance through your employer.  °  How to apply: °Eligibility screenings are held every Tuesday and Wednesday afternoon from 1:00 pm until 4:00 pm. You do not need an appointment for the interview!  °[facility] [address], [place], [place] 336-631-2330   °[facility]  336-342-8273   °[facility]  336-703-3100   °[facility]  336-570-6415   ° °Behavioral Health Resources in the Community: °Intensive Outpatient Programs °Organization         Address  Phone  Notes  °[facility] [address], [place], [place] 336-878-6098   °[facility] [address], [place], [place] 336-832-9800   °[facility] [address], [place], [place] ° 336-882-2125   °[facility] [address],  °[place], [place] 1-800-853-5163 or 336-641-4981   °Substance Abuse Resources °Organization         Address  Phone  Notes  °[facility]  336-882-2125   °[facility]  336-784-9470   °[facility]  336-285-9073   °[facility]  336-845-3988   °Residential & Outpatient Substance Abuse Program  1-800-659-3381   °Psychological Services °Organization         Address  Phone  Notes  °[facility]  336- 832-9600   °[facility]  336- 378-7881   °[facility] [address], [place] 1-800-853-5163 or 336-641-4981   ° °Mobile Crisis Teams °Organization         Address  Phone  Notes  °[facility], Mobile Crisis Care Unit  1-877-626-1772   °Assertive °[facility] ° [address]  [place], [place] 336-834-9664   °Sharon DeEsch [address] °[place] 336-554-5454   ° °Self-Help/Support Groups °Organization         Address  Phone             Notes  °[facility] of  - variety of support groups  336- 373-1402 Call for more information  °Narcotics Anonymous (NA), [facility] [address], °[place]  2 meetings at this location  ° °  Residential Treatment Programs Organization         Address  Phone  Notes  [facility] [address],    [place]  [phone]   [facility]  [address], [place] [phone], [place], [place] [phone]   [facility] [address], [place] [phone] Admissions: 8am-3pm M-F  [facility] [address] [place] [address]    [place], [place] 295-284-1324([phone])[phone]   [facility] [address], [place], [place] [phone]   [facility] [address]  [place], [place] [phone]   [facility] Intensive Outpatient [address], [place], [place] [phone]   [facility] [address]  [place], [place] [phone] or [phone](404) [phone]   [facility] [address] [place], [place] [phone] Accepts Medicaid  [facility] [address]  [place] [phone] Substance Abuse/Addiction Treatment   [facility] [facility] Resources Organization         Address  Phone  Notes  [facility]  310-872-8203(888) [phone]   Angie FavaJulie Brannon, PhD [address], [address] [place], [place]   850-336-6604(336) 670-171-3575 or (541) [phone](336) [phone]   [facility]   [address] [place], [place] [phone](336) [phone]   [facility] [address], [place], [place] 402-505-7120(336) [phone] Insurance/Medicaid/sponsorship through [facility] [address]                                    [place], [place] [phone](336) [phone] Therapy/tele-psych/case    [facility] [address]   [place], [place] [phone](336) [phone]    Dr. Lolly MustacheArfeen  [phone](336) [phone]   [facility]  [employer] [facility] 1) [address],  2) [address], [place] 3)  [address], [place] (339)[phone](336) ([phone]) [phone](336) [phone]  [phone](336) [phone]   [facility] Child Abuse Hotline 917-496-4995(336) 630 375 1346 or (254)[phone](336) (425)[phone] (After Hours)      Take the prescriptions as directed. Wear the knee immobilizer and use the crutches until you are seen in follow up. Apply moist heat or ice to the area(s) of discomfort, for 15 minutes at a time, several times per day for the next few days.  Do not fall asleep on a heating or ice pack.  Call your regular medical doctor and the Orthopedic doctor tomorrow to schedule a follow up appointment within the next week.  Return to the Emergency Department immediately if worsening.

## 2014-07-26 NOTE — ED Provider Notes (Signed)
CSN: [id number]     Arrival date & time 07/26/14  1451 History   First MD Initiated Contact with Patient 07/26/14 1829     Chief Complaint  Patient presents with  . Knee Pain  . Hypertension  . Medication Refill      HPI Pt was seen at 1925. Per pt, c/o sudden onset and resolution of one episode of slip and fall yesterday. Pt states he "twisted my left knee coming down the stairs." Pt also requesting a refill of his BP meds that he has "been out of since November." Pt states he has a PMD appointment next month (08/17/14). Denies CP/SOB, no direct injury to knee, no neck or back pain, no head injury, no abd pain, no N/V/D, no focal motor weakness, no tingling/numbness in extremities.    Past Medical History  Diagnosis Date  . Substance abuse   . Back pain   . Broken arm   . Broken ribs   . Hypertension    Past Surgical History  Procedure Laterality Date  . Back surgery    . Arm surgery      right arm    History  Substance Use Topics  . Smoking status: Current Every Day Smoker -- 1.00 packs/day    Types: Cigarettes  . Smokeless tobacco: Not on file  . Alcohol Use: No     Comment: rarely    Review of Systems ROS: Statement: All systems negative except as marked or noted in the HPI; Constitutional: Negative for fever and chills. ; ; Eyes: Negative for eye pain, redness and discharge. ; ; ENMT: Negative for ear pain, hoarseness, nasal congestion, sinus pressure and sore throat. ; ; Cardiovascular: Negative for chest pain, palpitations, diaphoresis, dyspnea and peripheral edema. ; ; Respiratory: Negative for cough, wheezing and stridor. ; ; Gastrointestinal: Negative for nausea, vomiting, diarrhea, abdominal pain, blood in stool, hematemesis, jaundice and rectal bleeding. . ; ; Genitourinary: Negative for dysuria, flank pain and hematuria. ; ; Musculoskeletal: +knee pain. Negative for back pain and neck pain. Negative for swelling and deformity.; ; Skin: Negative for pruritus, rash,  abrasions, blisters, bruising and skin lesion.; ; Neuro: Negative for headache, lightheadedness and neck stiffness. Negative for weakness, altered level of consciousness , altered mental status, extremity weakness, paresthesias, involuntary movement, seizure and syncope.      Allergies  Review of patient's allergies indicates no known allergies.  Home Medications   Prior to Admission medications   Medication Sig Start Date End Date Taking? Authorizing Provider  Naproxen Sodium 220 MG CAPS Take 220-440 mg by mouth daily as needed (for pain).   Yes Historical Provider, MD  cyclobenzaprine (FLEXERIL) 10 MG tablet Take 1 tablet (10 mg total) by mouth 3 (three) times daily as needed for muscle spasms. Patient not taking: Reported on 07/26/2014 12/21/12   Tammy L. Triplett, PA-C  predniSONE (DELTASONE) 10 MG tablet Take 6 tablets day one, 5 tablets day two, 4 tablets day three, 3 tablets day four, 2 tablets day five, then 1 tablet day six Patient not taking: Reported on 07/26/2014 12/21/12   Tammy L. Triplett, PA-C   BP 186/125 mmHg  Pulse 103  Temp(Src) 98.1 F (36.7 C) (Oral)  Resp 16  Ht 6\' 3"  (1.905 m)  Wt 270 lb (122.471 kg)  BMI 33.75 kg/m2  SpO2 98% Physical Exam  1930; Physical examination:  Nursing notes reviewed; Vital signs and O2 SAT reviewed;  Constitutional: Well developed, Well nourished, Well hydrated, In no acute distress;  Head:  Normocephalic, atraumatic; Eyes: EOMI, PERRL, No scleral icterus; ENMT: Mouth and pharynx normal, Mucous membranes moist; Neck: Supple, Full range of motion, No lymphadenopathy; Cardiovascular: Regular rate and rhythm, No murmur, rub, or gallop; Respiratory: Breath sounds clear & equal bilaterally, No rales, rhonchi, wheezes.  Speaking full sentences with ease, Normal respiratory effort/excursion; Chest: Nontender, Movement normal; Abdomen: Soft, Nontender, Nondistended, Normal bowel sounds; Genitourinary: No CVA tenderness; Extremities: Pulses normal,  +FROM left knee, including able to lift extended LLE off stretcher, and extend left lower leg against resistance.  No ligamentous laxity.  No patellar or quad tendon step-offs.  NMS intact left foot, strong pedal pp. +plantarflexion of left foot w/calf squeeze.  No palpable gap left Achilles's tendon.  No proximal fibular head tenderness.  No edema, erythema, warmth, ecchymosis or deformity.  +TTP right medial knee. No deformity, No edema, No calf edema or asymmetry.; Neuro: AA&Ox3, Major CN grossly intact.  Speech clear. No gross focal motor or sensory deficits in extremities.; Skin: Color normal, Warm, Dry.   ED Course  Procedures     EKG Interpretation None      MDM  MDM Reviewed: previous chart, vitals and nursing note Reviewed previous: labs Interpretation: labs and x-ray      Results for orders placed or performed during the hospital encounter of 07/26/14  I-stat Chem 8, ED  Result Value Ref Range   Sodium 140 135 - 145 mmol/L   Potassium 3.9 3.5 - 5.1 mmol/L   Chloride 103 96 - 112 mmol/L   BUN 9 6 - 23 mg/dL   Creatinine, Ser 6.96 0.50 - 1.35 mg/dL   Glucose, Bld 92 70 - 99 mg/dL   Calcium, Ion 2.95 2.84 - 1.23 mmol/L   TCO2 22 0 - 100 mmol/L   Hemoglobin 17.7 (H) 13.0 - 17.0 g/dL   HCT 13.2 44.0 - 10.2 %   Dg Knee Complete 4 Views Left 07/26/2014   CLINICAL DATA:  Left knee pain following fall on ice, initial encounter  EXAM: LEFT KNEE - COMPLETE 4+ VIEW  COMPARISON:  08/21/2008  FINDINGS: There is no evidence of fracture, dislocation, or joint effusion. There is no evidence of arthropathy or other focal bone abnormality. Soft tissues are unremarkable.  IMPRESSION: No acute abnormality noted.   Electronically Signed   By: Alcide Clever M.D.   On: 07/26/2014 15:49    2035:  Will tx knee pain symptomatically, f/u Ortho (pt states he has been seen at [facility] previously). Will refill pt's BP meds.  Dx and testing d/w pt.  Questions answered.  Verb understanding,  agreeable to d/c home with outpt f/u.      Samuel Jester, DO 07/29/14 [phone]

## 2014-08-17 DIAGNOSIS — R7309 Other abnormal glucose: Secondary | ICD-10-CM | POA: Diagnosis not present

## 2014-08-17 DIAGNOSIS — F17200 Nicotine dependence, unspecified, uncomplicated: Secondary | ICD-10-CM | POA: Diagnosis not present

## 2014-08-17 DIAGNOSIS — R319 Hematuria, unspecified: Secondary | ICD-10-CM | POA: Diagnosis not present

## 2014-08-17 DIAGNOSIS — F329 Major depressive disorder, single episode, unspecified: Secondary | ICD-10-CM | POA: Diagnosis not present

## 2014-08-17 DIAGNOSIS — E669 Obesity, unspecified: Secondary | ICD-10-CM | POA: Diagnosis not present

## 2014-08-17 DIAGNOSIS — F419 Anxiety disorder, unspecified: Secondary | ICD-10-CM | POA: Diagnosis not present

## 2014-08-17 DIAGNOSIS — E784 Other hyperlipidemia: Secondary | ICD-10-CM | POA: Diagnosis not present

## 2014-08-17 DIAGNOSIS — R809 Proteinuria, unspecified: Secondary | ICD-10-CM | POA: Diagnosis not present

## 2014-08-31 ENCOUNTER — Ambulatory Visit (INDEPENDENT_AMBULATORY_CARE_PROVIDER_SITE_OTHER): Payer: Medicare Other | Admitting: Emergency Medicine

## 2014-08-31 VITALS — BP 148/100 | HR 149 | Temp 97.8°F | Resp 20 | Ht 73.5 in | Wt 275.0 lb

## 2014-08-31 DIAGNOSIS — K610 Anal abscess: Secondary | ICD-10-CM

## 2014-08-31 DIAGNOSIS — G894 Chronic pain syndrome: Secondary | ICD-10-CM | POA: Diagnosis not present

## 2014-08-31 MED ORDER — CIPROFLOXACIN HCL 500 MG PO TABS: 500.0000 mg | ORAL_TABLET | Freq: Two times a day (BID) | ORAL | Status: DC

## 2014-08-31 MED ORDER — METRONIDAZOLE 500 MG PO TABS: 500.0000 mg | ORAL_TABLET | Freq: Four times a day (QID) | ORAL | Status: DC

## 2014-08-31 MED ORDER — HYDROCODONE-ACETAMINOPHEN 5-325 MG PO TABS: ORAL_TABLET | ORAL | Status: DC

## 2014-08-31 NOTE — Progress Notes (Signed)
[facility] [address], [place] [id number] [phone]  Date:  08/31/2014   Name:  Russell AlleyRobert Cochran   DOB:  07/11/1965   MRN:  [id number]  PCP:  No PCP Per Patient    Chief Complaint: boil on buttock   History of Present Illness:  [name] is a 49 y.o. very pleasant male patient who presents with the following:  Has a mass on his right buttock for two days that is very painful Spontaneously drainage at times No fever or chills.  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 Currently on medicare disability Pain not radiating and no neuro symptoms. No improvement with over the counter medications or other home remedies.  Denies other complaint or health concern today.   Patient Active Problem List   Diagnosis Date Noted  . Lesion of ulnar nerve 12/21/2008  . DERANGEMENT MENISCUS 12/21/2008  . KNEE PAIN 12/21/2008  . Jennette BankerBURSITIS, OLECRANON 12/21/2008    Past Medical History  Diagnosis Date  . Substance abuse   . Back pain   . Broken arm   . Broken ribs   . Hypertension     Past Surgical History  Procedure Laterality Date  . Back surgery    . Arm surgery      right arm    History  Substance Use Topics  . Smoking status: Current Every Day Smoker -- 1.00 packs/day    Types: Cigarettes  . Smokeless tobacco: Not on file  . Alcohol Use: No     Comment: rarely    No family history on file.  No Known Allergies  Medication list has been reviewed and updated.  Current Outpatient Prescriptions on File Prior to Visit  Medication Sig Dispense Refill  . amLODipine (NORVASC) 2.5 MG tablet Take 1 tablet (2.5 mg total) by mouth daily. 30 tablet 0  . lisinopril (PRINIVIL,ZESTRIL) 10 MG tablet Take 1 tablet (10 mg total) by  mouth daily. 30 tablet 0  . cyclobenzaprine (FLEXERIL) 10 MG tablet Take 1 tablet (10 mg total) by mouth 3 (three) times daily as needed for muscle spasms. (Patient not taking: Reported on 07/26/2014) 21 tablet 0  . HYDROcodone-acetaminophen (NORCO/VICODIN) 5-325 MG per tablet 1 or 2 tabs PO q6 hours prn pain (Patient not taking: Reported on 08/31/2014) 20 tablet 0  . Naproxen Sodium 220 MG CAPS Take 220-440 mg by mouth daily as needed (for pain).     No current facility-administered medications on file prior to visit.    Review of Systems:  As per HPI, otherwise negative.    Physical Examination: Filed Vitals:   08/31/14 0950  BP: 148/100  Pulse: 149  Temp: 97.8 F (36.6 C)  Resp: 20   Filed Vitals:   08/31/14 0950  Height: 6' 1.5" (1.867 m)  Weight: 275 lb (124.739 kg)   Body mass index is 35.79 kg/(m^2). Ideal Body Weight: Weight in (lb) to have BMI = 25: 191.7  GEN: obese, NAD, Non-toxic, A & O x 3 HEENT: Atraumatic, Normocephalic. Neck supple. No masses, No LAD. Ears and Nose: No external deformity. CV: RRR, No M/G/R. No JVD. No thrill. No extra heart sounds. PULM: CTA B, no wheezes, crackles, rhonchi. No retractions. No resp.  distress. No accessory muscle use. ABD: S, NT, ND, +BS. No rebound. No HSM. EXTR: No c/c/e NEURO Normal gait.  PSYCH: Normally interactive. Conversant. Not depressed or anxious appearing.  Calm demeanor.  Anorectal: erythematous tender region on right buttock.  No induration or fluctuance  Assessment and Plan: Perianal abscess cipro Flagyl Sitz soaks Chronic pain Refer to management To obtain records prior to any action   Signed,  Phillips Odor, MD

## 2014-08-31 NOTE — Patient Instructions (Signed)
Chronic Back Pain  When back pain lasts longer than 3 months, it is called chronic back pain.People with chronic back pain often go through certain periods that are more intense (flare-ups).  CAUSES Chronic back pain can be caused by wear and tear (degeneration) on different structures in your back. These structures include:  The bones of your spine (vertebrae) and the joints surrounding your spinal cord and nerve roots (facets).  The strong, fibrous tissues that connect your vertebrae (ligaments). Degeneration of these structures may result in pressure on your nerves. This can lead to constant pain. HOME CARE INSTRUCTIONS  Avoid bending, heavy lifting, prolonged sitting, and activities which make the problem worse.  Take brief periods of rest throughout the day to reduce your pain. Lying down or standing usually is better than sitting while you are resting.  Take over-the-counter or prescription medicines only as directed by your caregiver. SEEK IMMEDIATE MEDICAL CARE IF:   You have weakness or numbness in one of your legs or feet.  You have trouble controlling your bladder or bowels.  You have nausea, vomiting, abdominal pain, shortness of breath, or fainting. Document Released: 07/03/2004 Document Revised: 08/18/2011 Document Reviewed: 05/10/2011 [facility] Patient Information 2015 [place]. This information is not intended to replace advice given to you by your health care provider. Make sure you discuss any questions you have with your health care provider. Perianal Abscess An abscess is an infected area that contains a collection of pus and debris. A perianal abscess is one that occurs in the perineal area, which is the area between the anus and the scrotum in males and between the anus and the vagina in females. Perianal abscesses can vary in size. Without treatment, a perianal abscess can become larger and cause other problems. CAUSES  Glands in the perineal area can become  plugged up with debris. When this happens, an abscess may form.  SIGNS AND SYMPTOMS  The most common symptoms of a perianal abscess are:  Swelling and redness in the area of the abscess. The redness may go beyond the abscess and appear as a red streak on the skin.  Pain in the area of the abscess. Other possible symptoms include:   A visible lump or a lump that can be felt when touching the area and is usually painful.  Bleeding or pus-like discharge from the area.  Fever.  General weakness. DIAGNOSIS  Your health care provider will take a medical history and examine the area. This may involve examining the rectal area with a gloved hand (digital rectal exam). For women, it may require a careful vaginal exam. Sometimes, the health care provider needs to look into the rectum using a probe or scope. TREATMENT  Treatment often requires making a cut (incision) in the abscess to drain the pus. This can sometimes be done in your health care provider's office or an emergency department after giving you medicine to numb the area (local anesthetic). For larger or deeper abscesses, surgery may be required to drain the abscess. Antibiotic medicines are sometimes given if there is infection of the surrounding tissue (cellulitis). In some cases, gauze is packed into the abscess to continue draining the area. Frequent sitz baths may be recommended to help the wound heal and to reduce the chance of the abscess coming back. HOME CARE INSTRUCTIONS   Only take over-the-counter or prescription medicines for pain, fever, or discomfort as directed by your health care provider.  Take antibiotic medicine as directed. Make sure you finish  it even if you start to feel better.  If gauze is used in the abscess, follow your health care provider's instructions for removing or changing the gauze. It can usually be removed in 2-3 days.  If one or more drains have been placed in the abscess cavity, be careful not to  pull at them. Your health care provider will tell you how long they need to remain in place.  Take warm sitz baths 3-4 times a day and after bowel movements. This will help reduce pain and swelling.  Keep the skin around the abscess clean and dry. Avoid cleaning the area too much.  Avoid scratching the abscess area.  Avoid using colored or perfumed toilet papers. SEEK MEDICAL CARE IF:   You have trouble having a bowel movement or passing urine.  Your pain or swelling in the affected area does not seem to be improving.  The gauze packing or the drains come out before the planned time. SEEK IMMEDIATE MEDICAL CARE IF:   You have problems moving or using your legs.  You have severe or increasing pain.  Your swelling in the affected area suddenly gets worse.  You have a large increase in bleeding or passing of pus.  You have chills or a fever. MAKE SURE YOU:   Understand these instructions.  Will watch your condition.  Will get help right away if you are not doing well or get worse. Document Released: 07/02/2006 Document Revised: 03/16/2013 Document Reviewed: 01/05/2013 [facility] Patient Information 2015 [place], [place]. This information is not intended to replace advice given to you by your health care provider. Make sure you discuss any questions you have with your health care provider.

## 2014-09-14 ENCOUNTER — Ambulatory Visit (INDEPENDENT_AMBULATORY_CARE_PROVIDER_SITE_OTHER): Payer: Medicare Other | Admitting: Family Medicine

## 2014-09-14 VITALS — BP 126/80 | HR 124 | Temp 98.0°F | Resp 16 | Ht 73.5 in | Wt 271.0 lb

## 2014-09-14 DIAGNOSIS — G894 Chronic pain syndrome: Secondary | ICD-10-CM | POA: Insufficient documentation

## 2014-09-14 DIAGNOSIS — I10 Essential (primary) hypertension: Secondary | ICD-10-CM | POA: Diagnosis not present

## 2014-09-14 DIAGNOSIS — F411 Generalized anxiety disorder: Secondary | ICD-10-CM | POA: Insufficient documentation

## 2014-09-14 MED ORDER — TRAMADOL HCL 50 MG PO TABS: 50.0000 mg | ORAL_TABLET | Freq: Three times a day (TID) | ORAL | Status: DC | PRN

## 2014-09-14 MED ORDER — DULOXETINE HCL 30 MG PO CPEP: 30.0000 mg | ORAL_CAPSULE | Freq: Every day | ORAL | Status: AC

## 2014-09-14 MED ORDER — CYCLOBENZAPRINE HCL 10 MG PO TABS: 10.0000 mg | ORAL_TABLET | Freq: Three times a day (TID) | ORAL | Status: DC | PRN
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
## 2014-09-14 NOTE — Patient Instructions (Addendum)
Tramadol tablets What is this medicine? TRAMADOL (TRA ma dole) is a pain reliever. It is used to treat moderate to severe pain in adults. This medicine may be used for other purposes; ask your health care provider or pharmacist if you have questions. COMMON BRAND NAME(S): Ultram What should I tell my health care provider before I take this medicine? They need to know if you have any of these conditions: -brain tumor -depression -drug abuse or addiction -head injury -if you frequently drink alcohol containing drinks -kidney disease or trouble passing urine -liver disease -lung disease, asthma, or breathing problems -seizures or epilepsy -suicidal thoughts, plans, or attempt; a previous suicide attempt by you or a family member -an unusual or allergic reaction to tramadol, codeine, other medicines, foods, dyes, or preservatives -pregnant or trying to get pregnant -breast-feeding How should I use this medicine? Take this medicine by mouth with a full glass of water. Follow the directions on the prescription label. If the medicine upsets your stomach, take it with food or milk. Do not take more medicine than you are told to take. Talk to your pediatrician regarding the use of this medicine in children. Special care may be needed. Overdosage: If you think you have taken too much of this medicine contact a poison control center or emergency room at once. NOTE: This medicine is only for you. Do not share this medicine with others. What if I miss a dose? If you miss a dose, take it as soon as you can. If it is almost time for your next dose, take only that dose. Do not take double or extra doses. What may interact with this medicine? Do not take this medicine with any of the following medications: -MAOIs like Carbex, Eldepryl, Marplan, Nardil, and Parnate This medicine may also interact with the following medications: -alcohol or medicines that contain  alcohol -antihistamines -benzodiazepines -bupropion -carbamazepine or oxcarbazepine -clozapine -cyclobenzaprine -digoxin -furazolidone -linezolid -medicines for depression, anxiety, or psychotic disturbances -medicines for migraine headache like almotriptan, eletriptan, frovatriptan, naratriptan, rizatriptan, sumatriptan, zolmitriptan -medicines for pain like pentazocine, buprenorphine, butorphanol, meperidine, nalbuphine, and propoxyphene -medicines for sleep -muscle relaxants -naltrexone -phenobarbital -phenothiazines like perphenazine, thioridazine, chlorpromazine, mesoridazine, fluphenazine, prochlorperazine, promazine, and trifluoperazine -procarbazine -warfarin This list may not describe all possible interactions. Give your health care provider a list of all the medicines, herbs, non-prescription drugs, or dietary supplements you use. Also tell them if you smoke, drink alcohol, or use illegal drugs. Some items may interact with your medicine. What should I watch for while using this medicine? Tell your doctor or health care professional if your pain does not go away, if it gets worse, or if you have new or a different type of pain. You may develop tolerance to the medicine. Tolerance means that you will need a higher dose of the medicine for pain relief. Tolerance is normal and is expected if you take this medicine for a long time. Do not suddenly stop taking your medicine because you may develop a severe reaction. Your body becomes used to the medicine. This does NOT mean you are addicted. Addiction is a behavior related to getting and using a drug for a non-medical reason. If you have pain, you have a medical reason to take pain medicine. Your doctor will tell you how much medicine to take. If your doctor wants you to stop the medicine, the dose will be slowly lowered over time to avoid any side effects. You may get drowsy or dizzy. Do not drive, use  machinery, or do anything that  needs mental alertness until you know how this medicine affects you. Do not stand or sit up quickly, especially if you are an older patient. This reduces the risk of dizzy or fainting spells. Alcohol can increase or decrease the effects of this medicine. Avoid alcoholic drinks. You may have constipation. Try to have a bowel movement at least every 2 to 3 days. If you do not have a bowel movement for 3 days, call your doctor or health care professional. Your mouth may get dry. Chewing sugarless gum or sucking hard candy, and drinking plenty of water may help. Contact your doctor if the problem does not go away or is severe. What side effects may I notice from receiving this medicine? Side effects that you should report to your doctor or health care professional as soon as possible: -allergic reactions like skin rash, itching or hives, swelling of the face, lips, or tongue -breathing difficulties, wheezing -confusion -itching -light headedness or fainting spells -redness, blistering, peeling or loosening of the skin, including inside the mouth -seizures Side effects that usually do not require medical attention (report to your doctor or health care professional if they continue or are bothersome): -constipation -dizziness -drowsiness -headache -nausea, vomiting This list may not describe all possible side effects. Call your doctor for medical advice about side effects. You may report side effects to FDA at 1-800-FDA-1088. Where should I keep my medicine? Keep out of the reach of children. Store at room temperature between 15 and 30 degrees C (59 and 86 degrees F). Keep container tightly closed. Throw away any unused medicine after the expiration date. NOTE: This sheet is a summary. It may not cover all possible information. If you have questions about this medicine, talk to your doctor, pharmacist, or health care provider.  2015, Elsevier/Gold Standard. (2010-02-06 11:55:44) Duloxetine  delayed-release capsules What is this medicine? DULOXETINE (doo LOX e teen) is used to treat depression, anxiety, and different types of chronic pain. This medicine may be used for other purposes; ask your health care provider or pharmacist if you have questions. COMMON BRAND NAME(S): Cymbalta What should I tell my health care provider before I take this medicine? They need to know if you have any of these conditions: -bipolar disorder or a family history of bipolar disorder -glaucoma -kidney disease -liver disease -suicidal thoughts or a previous suicide attempt -taken medicines called MAOIs like Carbex, Eldepryl, Marplan, Nardil, and Parnate within 14 days -an unusual reaction to duloxetine, other medicines, foods, dyes, or preservatives -pregnant or trying to get pregnant -breast-feeding How should I use this medicine? Take this medicine by mouth with a glass of water. Follow the directions on the prescription label. Do not cut, crush or chew this medicine. You can take this medicine with or without food. Take your medicine at regular intervals. Do not take your medicine more often than directed. Do not stop taking this medicine suddenly except upon the advice of your doctor. Stopping this medicine too quickly may cause serious side effects or your condition may worsen. A special MedGuide will be given to you by the pharmacist with each prescription and refill. Be sure to read this information carefully each time. Talk to your pediatrician regarding the use of this medicine in children. While this drug may be prescribed for children as young as 317 years of age for selected conditions, precautions do apply. Overdosage: If you think you have taken too much of this medicine contact a poison control  center or emergency room at once. NOTE: This medicine is only for you. Do not share this medicine with others. What if I miss a dose? If you miss a dose, take it as soon as you can. If it is almost  time for your next dose, take only that dose. Do not take double or extra doses. What may interact with this medicine? Do not take this medicine with any of the following medications: -certain diet drugs like dexfenfluramine, fenfluramine -desvenlafaxine -linezolid -MAOIs like Azilect, Carbex, Eldepryl, Marplan, Nardil, and Parnate -methylene blue (intravenous) -milnacipran -thioridazine -venlafaxine This medicine may also interact with the following medications: -alcohol -aspirin and aspirin-like medicines -certain antibiotics like ciprofloxacin and enoxacin -certain medicines for blood pressure, heart disease, irregular heart beat -certain medicines for depression, anxiety, or psychotic disturbances -certain medicines for migraine headache like almotriptan, eletriptan, frovatriptan, naratriptan, rizatriptan, sumatriptan, zolmitriptan -certain medicines that treat or prevent blood clots like warfarin, enoxaparin, and dalteparin -cimetidine -fentanyl -lithium -NSAIDS, medicines for pain and inflammation, like ibuprofen or naproxen -phentermine -procarbazine -sibutramine -St. John's wort -theophylline -tramadol -tryptophan This list may not describe all possible interactions. Give your health care provider a list of all the medicines, herbs, non-prescription drugs, or dietary supplements you use. Also tell them if you smoke, drink alcohol, or use illegal drugs. Some items may interact with your medicine. What should I watch for while using this medicine? Tell your doctor if your symptoms do not get better or if they get worse. Visit your doctor or health care professional for regular checks on your progress. Because it may take several weeks to see the full effects of this medicine, it is important to continue your treatment as prescribed by your doctor. Patients and their families should watch out for new or worsening thoughts of suicide or depression. Also watch out for sudden  changes in feelings such as feeling anxious, agitated, panicky, irritable, hostile, aggressive, impulsive, severely restless, overly excited and hyperactive, or not being able to sleep. If this happens, especially at the beginning of treatment or after a change in dose, call your health care professional. Bonita Quin may get drowsy or dizzy. Do not drive, use machinery, or do anything that needs mental alertness until you know how this medicine affects you. Do not stand or sit up quickly, especially if you are an older patient. This reduces the risk of dizzy or fainting spells. Alcohol may interfere with the effect of this medicine. Avoid alcoholic drinks. This medicine can cause an increase in blood pressure. This medicine can also cause a sudden drop in your blood pressure, which may make you feel faint and increase the chance of a fall. These effects are most common when you first start the medicine or when the dose is increased, or during use of other medicines that can cause a sudden drop in blood pressure. Check with your doctor for instructions on monitoring your blood pressure while taking this medicine. Your mouth may get dry. Chewing sugarless gum or sucking hard candy, and drinking plenty of water may help. Contact your doctor if the problem does not go away or is severe. What side effects may I notice from receiving this medicine? Side effects that you should report to your doctor or health care professional as soon as possible: -allergic reactions like skin rash, itching or hives, swelling of the face, lips, or tongue -changes in blood pressure -confusion -dark urine -dizziness -fast talking and excited feelings or actions that are out of control -fast, irregular heartbeat -  fever -general ill feeling or flu-like symptoms -hallucination, loss of contact with reality -light-colored stools -loss of balance or coordination -redness, blistering, peeling or loosening of the skin, including inside  the mouth -right upper belly pain -seizures -suicidal thoughts or other mood changes -trouble concentrating -trouble passing urine or change in the amount of urine -unusual bleeding or bruising -unusually weak or tired -yellowing of the eyes or skin Side effects that usually do not require medical attention (report to your doctor or health care professional if they continue or are bothersome): -blurred vision -change in appetite -change in sex drive or performance -headache -increased sweating -nausea This list may not describe all possible side effects. Call your doctor for medical advice about side effects. You may report side effects to FDA at 1-800-FDA-1088. Where should I keep my medicine? Keep out of the reach of children. Store at room temperature between 15 and 30 degrees C (59 and 86 degrees F). Throw away any unused medicine after the expiration date. NOTE: This sheet is a summary. It may not cover all possible information. If you have questions about this medicine, talk to your doctor, pharmacist, or health care provider.  2015, Elsevier/Gold Standard. (2013-05-17 14:20:31)

## 2014-09-26 ENCOUNTER — Telehealth: Payer: Self-pay | Admitting: Emergency Medicine

## 2014-09-26 NOTE — Telephone Encounter (Signed)
Patient states that he dropped off records last week for Dr. Dareen PianoAnderson to review. He wants to know if the records have looked at yet. Patient also states that he wants some pain medicine prescribed.

## 2014-09-26 NOTE — Telephone Encounter (Signed)
Dr Dareen PianoAnderson-- Have you received/reviewed records? Also, pt wants pain meds?

## 2014-09-27 NOTE — Telephone Encounter (Signed)
I have seen no records

## 2014-09-27 NOTE — Telephone Encounter (Signed)
Pt called again regarding his records and also if he could get some pain medicine. Please call [phone]    [facility] [place]

## 2014-09-28 NOTE — Telephone Encounter (Signed)
Can you help locate these records.

## 2014-09-28 NOTE — Telephone Encounter (Signed)
Patient called to follow up on the last message. Patient states that if he doesn't hear anything that he'll come in and see Dr. Dareen PianoAnderson

## 2014-09-29 NOTE — Telephone Encounter (Signed)
Patient was seen on 09/14/14 by Dr. Conley RollsLe. Per her office notes, patient had medical records with him at that time and she reviewed them and records sent for scanning. I've checked all scan boxes and did not locate records. Due to medical records sending records to the scan center a few times each week, records were probably already sent out. The media section of the chart should be checked for scanned documents.

## 2014-09-30 ENCOUNTER — Ambulatory Visit (INDEPENDENT_AMBULATORY_CARE_PROVIDER_SITE_OTHER): Payer: Medicare Other | Admitting: Emergency Medicine

## 2014-09-30 VITALS — BP 122/90 | HR 108 | Temp 97.8°F | Resp 20 | Ht 73.5 in | Wt 260.8 lb

## 2014-09-30 DIAGNOSIS — I10 Essential (primary) hypertension: Secondary | ICD-10-CM

## 2014-09-30 DIAGNOSIS — K2970 Gastritis, unspecified, without bleeding: Secondary | ICD-10-CM

## 2014-09-30 DIAGNOSIS — G894 Chronic pain syndrome: Secondary | ICD-10-CM

## 2014-09-30 DIAGNOSIS — K2990 Gastroduodenitis, unspecified, without bleeding: Secondary | ICD-10-CM

## 2014-09-30 LAB — COMPREHENSIVE METABOLIC PANEL
ALT: 56 U/L — AB (ref 0–53)
AST: 34 U/L (ref 0–37)
Albumin: 4.4 g/dL (ref 3.5–5.2)
Alkaline Phosphatase: 84 U/L (ref 39–117)
BUN: 10 mg/dL (ref 6–23)
CALCIUM: 10.2 mg/dL (ref 8.4–10.5)
CHLORIDE: 100 meq/L (ref 96–112)
CO2: 19 mEq/L (ref 19–32)
Creat: 1.59 mg/dL — ABNORMAL HIGH (ref 0.50–1.35)
Glucose, Bld: 101 mg/dL — ABNORMAL HIGH (ref 70–99)
Potassium: 4.6 mEq/L (ref 3.5–5.3)
SODIUM: 133 meq/L — AB (ref 135–145)
Total Bilirubin: 0.5 mg/dL (ref 0.2–1.2)
Total Protein: 9 g/dL — ABNORMAL HIGH (ref 6.0–8.3)

## 2014-09-30 LAB — POCT CBC
Granulocyte percent: 62.1 %G (ref 37–80)
HCT, POC: 53.7 % (ref 43.5–53.7)
Hemoglobin: 17.7 g/dL (ref 14.1–18.1)
Lymph, poc: 2.6 (ref 0.6–3.4)
MCH, POC: 27.4 pg (ref 27–31.2)
MCHC: 33 g/dL (ref 31.8–35.4)
MCV: 83 fL (ref 80–97)
MID (cbc): 0.5 (ref 0–0.9)
MPV: 7.3 fL (ref 0–99.8)
PLATELET COUNT, POC: 237 10*3/uL (ref 142–424)
POC Granulocyte: 5 (ref 2–6.9)
POC LYMPH PERCENT: 31.6 %L (ref 10–50)
POC MID %: 6.3 % (ref 0–12)
RBC: 6.47 M/uL — AB (ref 4.69–6.13)
RDW, POC: 13.8 %
WBC: 8.1 10*3/uL (ref 4.6–10.2)

## 2014-09-30 LAB — LIPASE: Lipase: 20 U/L (ref 0–75)

## 2014-09-30 LAB — AMYLASE: Amylase: 40 U/L (ref 0–105)

## 2014-09-30 MED ORDER — TRAMADOL HCL 50 MG PO TABS: 50.0000 mg | ORAL_TABLET | Freq: Three times a day (TID) | ORAL | Status: DC | PRN

## 2014-09-30 MED ORDER — CYCLOBENZAPRINE HCL 10 MG PO TABS: 10.0000 mg | ORAL_TABLET | Freq: Three times a day (TID) | ORAL | Status: AC | PRN

## 2014-09-30 NOTE — Progress Notes (Signed)
[facility] [address], [place] [id number] [phone]  Date:  09/30/2014   Name:  Russell AlleyRobert Gonsalves   DOB:  03/31/1966   MRN:  [id number]  PCP:  No PCP Per Patient    Chief Complaint: Follow-up and Depression   History of Present Illness:  Russell AlleyRobert Aumiller is a 49 y.o. very pleasant male patient who presents with the following:  6 months history of epigastric pain into chest that he describes as burning and more frequently at night Some relief with OTC meds. No blood in stool or vomiting of blood No nausea or vomiting Large coffee intake Pain increases when he eats, especially fried and spicy foods. Did not accept referral to pain management made as they only do epidurals and he has failed those in past. No improvement with over the counter medications or other home remedies.  Denies other complaint or health concern today.   Patient Active Problem List   Diagnosis Date Noted  . Essential hypertension 09/14/2014  . Chronic pain syndrome 09/14/2014  . Generalized anxiety disorder 09/14/2014  . Lesion of ulnar nerve 12/21/2008  . DERANGEMENT MENISCUS 12/21/2008  . KNEE PAIN 12/21/2008  . Jennette BankerBURSITIS, OLECRANON 12/21/2008    Past Medical History  Diagnosis Date  . Back pain   . Broken arm   . Broken ribs   . Hypertension   . Anxiety   . Substance abuse     Has a history of cocaine use  . Pulmonary embolism   . Neuromuscular disorder     Past Surgical History  Procedure Laterality Date  . Back surgery    . Arm surgery      right arm    History  Substance Use Topics  . Smoking status: Current Every Day Smoker -- 1.00 packs/day    Types: Cigarettes  . Smokeless tobacco: Not on file  . Alcohol Use: No     Comment: rarely    History reviewed. No pertinent family history.  No Known Allergies  Medication list has been reviewed and updated.  Current Outpatient Prescriptions on File Prior to Visit  Medication Sig Dispense Refill  .  amLODipine (NORVASC) 2.5 MG tablet Take 1 tablet (2.5 mg total) by mouth daily. 30 tablet 0  . cyclobenzaprine (FLEXERIL) 10 MG tablet Take 1 tablet (10 mg total) by mouth 3 (three) times daily as needed for muscle spasms. 30 tablet 1  . DULoxetine (CYMBALTA) 30 MG capsule Take 1 capsule (30 mg total) by mouth daily. 30 capsule 3  . lisinopril (PRINIVIL,ZESTRIL) 10 MG tablet Take 1 tablet (10 mg total) by mouth daily. 30 tablet 0  . Naproxen Sodium 220 MG CAPS Take 220-440 mg by mouth daily as needed (for pain).    . traMADol (ULTRAM) 50 MG tablet Take 1 tablet (50 mg total) by mouth every 8 (eight) hours as needed. 30 tablet 0   No current facility-administered medications on file prior to visit.    Review of Systems:  As per HPI, otherwise negative.    Physical Examination: Filed Vitals:   09/30/14 1150  BP: 122/90  Pulse: 108  Temp: 97.8 F (36.6 C)  Resp: 20   Filed Vitals:   09/30/14 1150  Height: 6' 1.5" (1.867 m)  Weight: 260 lb 12.8 oz (118.298 kg)   Body mass index is 33.94 kg/(m^2). Ideal Body Weight: Weight in (lb) to have BMI = 25: 191.7   GEN: WDWN, NAD, Non-toxic, Alert & Oriented x 3 HEENT:  Atraumatic, Normocephalic.  Ears and Nose: No external deformity. EXTR: No clubbing/cyanosis/edema NEURO: Normal gait.  PSYCH: Normally interactive. Conversant. Not depressed or anxious appearing.  Calm demeanor.  ABD soft not tender no masses   Assessment and Plan: Gastritis vs GERD Labs Prevacid carafate Re-refer to pain management.  Patient has records.  Signed,  Phillips Odor, MD

## 2014-09-30 NOTE — Telephone Encounter (Signed)
Pt here to be seen today.

## 2014-09-30 NOTE — Patient Instructions (Signed)

## 2014-10-02 ENCOUNTER — Other Ambulatory Visit: Payer: Self-pay | Admitting: Emergency Medicine

## 2014-10-02 DIAGNOSIS — R7989 Other specified abnormal findings of blood chemistry: Secondary | ICD-10-CM

## 2014-10-02 LAB — H. PYLORI BREATH TEST: H. pylori Breath Test: NOT DETECTED

## 2014-10-15 ENCOUNTER — Other Ambulatory Visit: Payer: Self-pay | Admitting: Emergency Medicine

## 2014-10-17 ENCOUNTER — Telehealth: Payer: Self-pay

## 2014-10-17 NOTE — Telephone Encounter (Signed)
Pt states his ins will no longer pay for flexaril,he needs a generic called in   Best phone [phone]    Pharmacy [facility]

## 2014-10-17 NOTE — Telephone Encounter (Signed)
PA needed for cyclobenzaprine. Pt has tried/failed Lyrica, Cymbalta, Opana, Neurontin, Medrol, diazepam, Percocet, Flexeril, Soma previously. Completed on covermymeds. Pending.

## 2014-10-17 NOTE — Telephone Encounter (Signed)
Spoke with pt, advised prior authorization was done on this medication [facility]. I advised him that we would call him to let him know if insurance authorized this. Pt understood.

## 2014-10-17 NOTE — Telephone Encounter (Signed)
Called in Tramadol to pt's pharmacy.

## 2014-10-18 NOTE — Telephone Encounter (Signed)
Duplicate message, see notes under other message from 10/17/14.

## 2014-10-18 NOTE — Telephone Encounter (Signed)
PA was denied for generic cyclobenzaprine. I called pharm after reading pt's phone message from yesterday asking for generic to be ordered to verify that generic is what the pharm tried to run and it was. I also did the PA on generic, so it is not going to be covered for either name or generic and it is due to ins considering it high risk med. Alternatives on list from pharm req are Lyrica, meloxicam, and tizanidine. Pharm reported that cost to pay cash for cyclobenzaprine is under $20.  I called pt and he stated that even the $20 cash price for cyclobenzaprine is cost prohibitive for him bc he lives on disability. I suggested that he can try calling other pharmacies to check cash price of cyclobenzaprine. He is willing to try one of the listed alternatives, reporting that he has not tried the tizanidine or meloxicam before. He has tried the lyrica and it seemed to be helpful. Dr Dareen PianoAnderson, are any of these good alternatives for muscle spasms that should also have a low co-pay?

## 2014-10-19 ENCOUNTER — Other Ambulatory Visit: Payer: Self-pay | Admitting: Emergency Medicine

## 2014-10-19 MED ORDER — PREGABALIN 100 MG PO CAPS: 100.0000 mg | ORAL_CAPSULE | Freq: Two times a day (BID) | ORAL | Status: DC

## 2014-10-19 NOTE — Telephone Encounter (Signed)
sent 

## 2014-10-27 ENCOUNTER — Telehealth: Payer: Self-pay

## 2014-10-27 NOTE — Telephone Encounter (Signed)
Called pt to make sure he was aware a Rx for Lyrica had been sent in for him. He thanked us and will try to come in to see Dr Dareen PianoAnderson sometime next week for f/up.

## 2014-10-27 NOTE — Telephone Encounter (Signed)
Patient called to check to see if his medication for lyrica was ready. He was told it was ready for pick up. Later on patient called back to tell us that the medication her picked up was for Tramadol. We searched for the medication and it was no where to found. Please reprint medication and call patient when ready. [phone]

## 2014-10-29 MED ORDER — PREGABALIN 100 MG PO CAPS: 100.0000 mg | ORAL_CAPSULE | Freq: Two times a day (BID) | ORAL | Status: AC

## 2014-10-29 NOTE — Telephone Encounter (Signed)
Patient called to inform RX is ready left at front desk

## 2014-10-29 NOTE — Telephone Encounter (Signed)
Resent Lyrica to pharmacy

## 2017-09-06 ENCOUNTER — Other Ambulatory Visit: Payer: Self-pay

## 2017-09-06 ENCOUNTER — Encounter (HOSPITAL_COMMUNITY): Payer: Self-pay | Admitting: Cardiology

## 2017-09-06 ENCOUNTER — Emergency Department (HOSPITAL_COMMUNITY)
Admission: EM | Admit: 2017-09-06 | Discharge: 2017-09-06 | Disposition: A | Discharge status: Home / Self Care | Payer: Medicare Other | Attending: Emergency Medicine | Admitting: Emergency Medicine

## 2017-09-06 ENCOUNTER — Emergency Department (HOSPITAL_COMMUNITY): Payer: Medicare Other

## 2017-09-06 DIAGNOSIS — Y999 Unspecified external cause status: Secondary | ICD-10-CM | POA: Insufficient documentation

## 2017-09-06 DIAGNOSIS — F17210 Nicotine dependence, cigarettes, uncomplicated: Secondary | ICD-10-CM | POA: Diagnosis not present

## 2017-09-06 DIAGNOSIS — W228XXA Striking against or struck by other objects, initial encounter: Secondary | ICD-10-CM | POA: Diagnosis not present

## 2017-09-06 DIAGNOSIS — S62306A Unspecified fracture of fifth metacarpal bone, right hand, initial encounter for closed fracture: Secondary | ICD-10-CM

## 2017-09-06 DIAGNOSIS — Y9289 Other specified places as the place of occurrence of the external cause: Secondary | ICD-10-CM | POA: Diagnosis not present

## 2017-09-06 DIAGNOSIS — S62336A Displaced fracture of neck of fifth metacarpal bone, right hand, initial encounter for closed fracture: Secondary | ICD-10-CM | POA: Diagnosis not present

## 2017-09-06 DIAGNOSIS — I10 Essential (primary) hypertension: Secondary | ICD-10-CM | POA: Diagnosis not present

## 2017-09-06 DIAGNOSIS — Y9383 Activity, rough housing and horseplay: Secondary | ICD-10-CM | POA: Insufficient documentation

## 2017-09-06 DIAGNOSIS — Z79899 Other long term (current) drug therapy: Secondary | ICD-10-CM | POA: Diagnosis not present

## 2017-09-06 DIAGNOSIS — S6991XA Unspecified injury of right wrist, hand and finger(s), initial encounter: Secondary | ICD-10-CM | POA: Diagnosis present

## 2017-09-06 HISTORY — DX: Disorder of kidney and ureter, unspecified: N28.9

## 2017-09-06 HISTORY — DX: Pneumothorax, unspecified: J93.9

## 2017-09-06 MED ORDER — NAPROXEN 500 MG PO TABS: 500.0000 mg | ORAL_TABLET | Freq: Two times a day (BID) | ORAL | 0 refills | Status: AC
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
## 2017-09-06 NOTE — Discharge Instructions (Signed)
Call this week to schedule appointment with an orthopedic doctor, apply ice to help with the swelling, take medications as needed for pain.

## 2017-09-10 ENCOUNTER — Telehealth: Payer: Self-pay | Admitting: Orthopedic Surgery

## 2017-09-10 NOTE — Telephone Encounter (Signed)
[name] called for an appointment.  He stated he went to the [facility] on 09/06/17.  I told him that according to the AVS, there was listed 3 physicians Dr. Caryn BeeKevin Haddix, Dr. Hilda LiasKeeling and Dr. Romeo AppleHarrison.  He said he wanted to see Dr. Hilda LiasKeeling  I told him that Dr. Hilda LiasKeeling was out of the office until May 14th and that Dr. Romeo AppleHarrison didn't have any openings for a week to two weeks.  I suggested to him that maybe he should call Dr. Caryn BeeKevin Haddix's office for an appointment.  I asked him if he still had his paperwork from the ER with this phone number on it.  He said that he did and he would give them a call.

## 2017-09-14 ENCOUNTER — Encounter (HOSPITAL_COMMUNITY): Payer: Self-pay | Admitting: Emergency Medicine

## 2017-09-14 ENCOUNTER — Emergency Department (HOSPITAL_COMMUNITY)
Admission: EM | Admit: 2017-09-14 | Discharge: 2017-09-14 | Disposition: A | Discharge status: Home / Self Care | Payer: Medicare Other | Attending: Emergency Medicine | Admitting: Emergency Medicine

## 2017-09-14 ENCOUNTER — Other Ambulatory Visit: Payer: Self-pay

## 2017-09-14 DIAGNOSIS — R51 Headache: Secondary | ICD-10-CM | POA: Diagnosis present

## 2017-09-14 DIAGNOSIS — Y939 Activity, unspecified: Secondary | ICD-10-CM | POA: Insufficient documentation

## 2017-09-14 DIAGNOSIS — F10929 Alcohol use, unspecified with intoxication, unspecified: Secondary | ICD-10-CM | POA: Insufficient documentation

## 2017-09-14 DIAGNOSIS — Y929 Unspecified place or not applicable: Secondary | ICD-10-CM | POA: Insufficient documentation

## 2017-09-14 DIAGNOSIS — Z5321 Procedure and treatment not carried out due to patient leaving prior to being seen by health care provider: Secondary | ICD-10-CM | POA: Diagnosis not present

## 2017-09-14 DIAGNOSIS — Y998 Other external cause status: Secondary | ICD-10-CM | POA: Diagnosis not present
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
## 2017-09-14 NOTE — ED Notes (Signed)
Pt left ambulatory without difficulty.  States he is "just leaving"
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
## 2018-01-25 ENCOUNTER — Emergency Department (HOSPITAL_COMMUNITY)
Admission: EM | Admit: 2018-01-25 | Discharge: 2018-01-26 | Disposition: A | Discharge status: Home / Self Care | Payer: Medicare Other | Attending: Emergency Medicine | Admitting: Emergency Medicine

## 2018-01-25 ENCOUNTER — Emergency Department (HOSPITAL_COMMUNITY): Payer: Medicare Other

## 2018-01-25 ENCOUNTER — Other Ambulatory Visit: Payer: Self-pay

## 2018-01-25 ENCOUNTER — Encounter (HOSPITAL_COMMUNITY): Payer: Self-pay | Admitting: Emergency Medicine

## 2018-01-25 DIAGNOSIS — M542 Cervicalgia: Secondary | ICD-10-CM | POA: Insufficient documentation

## 2018-01-25 DIAGNOSIS — F17210 Nicotine dependence, cigarettes, uncomplicated: Secondary | ICD-10-CM | POA: Insufficient documentation

## 2018-01-25 DIAGNOSIS — N289 Disorder of kidney and ureter, unspecified: Secondary | ICD-10-CM | POA: Diagnosis not present

## 2018-01-25 DIAGNOSIS — R7989 Other specified abnormal findings of blood chemistry: Secondary | ICD-10-CM

## 2018-01-25 DIAGNOSIS — S20211A Contusion of right front wall of thorax, initial encounter: Secondary | ICD-10-CM | POA: Diagnosis not present

## 2018-01-25 DIAGNOSIS — Y92410 Unspecified street and highway as the place of occurrence of the external cause: Secondary | ICD-10-CM | POA: Diagnosis not present

## 2018-01-25 DIAGNOSIS — Z79899 Other long term (current) drug therapy: Secondary | ICD-10-CM | POA: Insufficient documentation

## 2018-01-25 DIAGNOSIS — Y999 Unspecified external cause status: Secondary | ICD-10-CM | POA: Diagnosis not present

## 2018-01-25 DIAGNOSIS — S299XXA Unspecified injury of thorax, initial encounter: Secondary | ICD-10-CM | POA: Diagnosis present

## 2018-01-25 DIAGNOSIS — M545 Low back pain: Secondary | ICD-10-CM | POA: Insufficient documentation

## 2018-01-25 DIAGNOSIS — I10 Essential (primary) hypertension: Secondary | ICD-10-CM | POA: Insufficient documentation

## 2018-01-25 DIAGNOSIS — Y9389 Activity, other specified: Secondary | ICD-10-CM | POA: Diagnosis not present

## 2018-01-25 LAB — TROPONIN I

## 2018-01-25 LAB — CBC WITH DIFFERENTIAL/PLATELET
BASOS PCT: 0 %
Basophils Absolute: 0 10*3/uL (ref 0.0–0.1)
EOS ABS: 0.3 10*3/uL (ref 0.0–0.7)
Eosinophils Relative: 2 %
HCT: 49 % (ref 39.0–52.0)
HEMOGLOBIN: 16.6 g/dL (ref 13.0–17.0)
Lymphocytes Relative: 26 %
Lymphs Abs: 3 10*3/uL (ref 0.7–4.0)
MCH: 30 pg (ref 26.0–34.0)
MCHC: 33.9 g/dL (ref 30.0–36.0)
MCV: 88.6 fL (ref 78.0–100.0)
Monocytes Absolute: 0.8 10*3/uL (ref 0.1–1.0)
Monocytes Relative: 7 %
NEUTROS PCT: 65 %
Neutro Abs: 7.3 10*3/uL (ref 1.7–7.7)
Platelets: 162 10*3/uL (ref 150–400)
RBC: 5.53 MIL/uL (ref 4.22–5.81)
RDW: 14.4 % (ref 11.5–15.5)
WBC: 11.4 10*3/uL — ABNORMAL HIGH (ref 4.0–10.5)

## 2018-01-25 LAB — BASIC METABOLIC PANEL
Anion gap: 10 (ref 5–15)
BUN: 12 mg/dL (ref 6–20)
CHLORIDE: 101 mmol/L (ref 98–111)
CO2: 24 mmol/L (ref 22–32)
CREATININE: 2.72 mg/dL — AB (ref 0.61–1.24)
Calcium: 9.1 mg/dL (ref 8.9–10.3)
GFR calc non Af Amer: 26 mL/min — ABNORMAL LOW (ref >60)
GFR, EST AFRICAN AMERICAN: 30 mL/min — AB (ref >60)
Glucose, Bld: 89 mg/dL (ref 70–99)
Potassium: 3.6 mmol/L (ref 3.5–5.1)
Sodium: 135 mmol/L (ref 135–145)

## 2018-01-25 MED ORDER — SODIUM CHLORIDE 0.9 % IV BOLUS (SEPSIS)
1000.0000 mL | Freq: Once | INTRAVENOUS | Status: AC
  Administered 2018-01-26: 1000 mL via INTRAVENOUS

## 2018-01-25 MED ORDER — SODIUM CHLORIDE 0.9 % IV BOLUS
1000.0000 mL | Freq: Once | INTRAVENOUS | Status: AC
  Administered 2018-01-25: 1000 mL via INTRAVENOUS

## 2018-01-25 MED ORDER — FENTANYL CITRATE (PF) 100 MCG/2ML IJ SOLN
50.0000 ug | Freq: Once | INTRAMUSCULAR | Status: AC
  Administered 2018-01-25: 50 ug via INTRAVENOUS
  Filled 2018-01-25: qty 2

## 2018-01-25 NOTE — ED Notes (Signed)
IV access obtained. NS bolus started and pain medication given. Pt to Radiology.
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
## 2018-01-26 MED ORDER — FENTANYL CITRATE (PF) 100 MCG/2ML IJ SOLN
50.0000 ug | Freq: Once | INTRAMUSCULAR | Status: AC
  Administered 2018-01-26: 50 ug via INTRAVENOUS
  Filled 2018-01-26: qty 2

## 2018-01-26 NOTE — Discharge Instructions (Signed)
Stop your blood pressure medicine for now. Follow-up with your provider as scheduled this week to recheck your renal labs and discuss blood pressure management. Ibuprofen, tylenol, or aleve for discomfort. Continue your suboxone as directed.

## 2018-01-26 NOTE — ED Notes (Signed)
Pt asking about pain medicine, states his "BP is better now and he wants more pain medicine" and if he will have anything prescribed for pain tomorrow. Dr Bebe ShaggyWickline aware.

## 2018-01-26 NOTE — ED Notes (Signed)
Pt ambulatory to bathroom and back to room- no distress noted- steady normal gait.
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
